# Patient Record
Sex: Male | Born: 1985 | Race: Black or African American | Hispanic: No | Marital: Single | Smoking: Current some day smoker
Health system: Southern US, Community
[De-identification: ages and names within clinical notes are randomized; demographics above are authoritative.]

## PROBLEM LIST (undated history)

## (undated) ENCOUNTER — Ambulatory Visit (HOSPITAL_COMMUNITY): Admission: EM | Payer: Self-pay

## (undated) DIAGNOSIS — F17200 Nicotine dependence, unspecified, uncomplicated: Secondary | ICD-10-CM

## (undated) DIAGNOSIS — F1910 Other psychoactive substance abuse, uncomplicated: Secondary | ICD-10-CM

## (undated) DIAGNOSIS — J45909 Unspecified asthma, uncomplicated: Secondary | ICD-10-CM

## (undated) DIAGNOSIS — F1290 Cannabis use, unspecified, uncomplicated: Secondary | ICD-10-CM

---
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
## 2007-08-05 ENCOUNTER — Emergency Department (HOSPITAL_COMMUNITY): Admission: EM | Admit: 2007-08-05 | Discharge: 2007-08-05 | Payer: Self-pay | Admitting: Emergency Medicine

## 2008-03-10 ENCOUNTER — Emergency Department (HOSPITAL_COMMUNITY): Admission: EM | Admit: 2008-03-10 | Discharge: 2008-03-10 | Payer: Self-pay | Admitting: Emergency Medicine

## 2010-09-10 ENCOUNTER — Emergency Department (HOSPITAL_COMMUNITY)
Admission: EM | Admit: 2010-09-10 | Discharge: 2010-09-10 | Disposition: A | Discharge status: Home / Self Care | Payer: Self-pay | Attending: Emergency Medicine | Admitting: Emergency Medicine

## 2010-09-10 DIAGNOSIS — R21 Rash and other nonspecific skin eruption: Secondary | ICD-10-CM | POA: Insufficient documentation

## 2010-09-10 DIAGNOSIS — B86 Scabies: Secondary | ICD-10-CM | POA: Insufficient documentation

## 2010-10-19 NOTE — Consult Note (Signed)
NAME:  Mario Jordan, Mario Jordan                     ACCOUNT NO.:  [id number]   MEDICAL RECORD NO.:  [id number]                   PATIENT TYPE:  EMS   LOCATION:  [facility]                                 FACILITY:  [facility]   PHYSICIAN:  Karol T. Lazarus Salines, M.D.              DATE OF BIRTH:  12/18/1985   DATE OF CONSULTATION:  03/26/2002  DATE OF DISCHARGE:  03/26/2002                                   CONSULTATION   CHIEF COMPLAINT:  Facial laceration.   HISTORY OF PRESENT ILLNESS:  The patient is a 25 year old black male who  stumbled and fell striking his face allegedly against a broken off metal  broomstick handle sustaining a laceration.  No apparent injury to the eye,  nose, or mouth.  No hearing problems, vision problems.  Occlusion is good.  No facial numbness.  No neck pain.  No radiating neurologic symptoms to  arms, legs, bowel, bladder.  Tetanus status is up-to-date.  He is otherwise  a healthy young man.   ALLERGIES:  No known drug allergies.   MEDICATIONS:  None.   SOCIAL HISTORY:  He is in the eighth grade at [employer].   REVIEW OF SYMPTOMS:  No known keloid formation or bleeding tendencies.   PHYSICAL EXAMINATION:  GENERAL:  This is an adolescent black male with a  large absorbant pad over his left face.  On removing this, he has an oblique  laceration beginning just lateral to the superior aspect of the nasal ala  going 1 cm lateral to the lateral canthus and then more deeply across the  superficial temporal region.  He has decent function of the upper and lower  eyelids and orbicularis oculi and also decent function of the left forehead.  Lower facial nerve is completely intact.  NEUROLOGIC:  Mental status is appropriate.  He hears well in conversational  speech.  Voice is clear, and respirations are unlabored.  HEENT:  Ears are clear with aerated drums of normal configuration.  Anterior  nose is clear internally.  Oral cavity reveals teeth in good repair with  moist membranes.  Oropharynx clear.  NECK:  Unremarkable.   On more careful inspection of the wound, the lower portion below the eye  next to the nose is superficial, although there is some elastic gapping of  the edges.  The upper portion goes through the subcutaneous fat and in some  areas down through the orbicularis oculi muscle.   IMPRESSION:  A 10 cm left mid sectional complex laceration with intact  facial nerve function.   PLAN:  With informed consent from his mother, anesthetized the mid face  beginning with Hurricane spray in the upper gingival buccal sulcus and an  infraorbital nerve block on that side.  A total of 20 cc of 2% Xylocaine  with 1:100,000 epinephrine used beginning with infraorbital nerve block and  then working along the wound on both sides.  Adequate time was  allowed for  hemostasis and anesthesia to take effect.  Upon ascertaining anesthesia, a  thorough scrubbing of the wound was performed with a 50/50 mixture of  Betadine and saline.  The deeper portions of the upper wound were probed  with the __________ forceps and no foreign material or foreign bodies were  identified.  The deeper aspects of the upper wound were closed with  interrupted 4-0 chromic suture, including the orbicularis oculi muscle and  the subcutaneous fat.  Finally, the remainder of the wound was closed taking  great pains to identify matching wound edges using interrupted 6-0 Ethilon  sutures.  Approximately 50 sutures total were placed.  The patient tolerated  the procedure nicely.  Hemostasis was observed.   We will have the patient back to his routine hygiene measures, including  wound cleaning with a Q-Tip and water and some antibiotic ointment  placement.  I have supplied him with some Tylenol No. 3 for pain relief.  He  will be using an ice pack for the first 24 hours and elevation for several  days.  We will take the sutures out in seven days.  They know to contact me  for any  signs of infection.  He is okay to go back to school on Monday,  three days from now.                                                Gloris Manchester. Lazarus Salines, M.D.    KTW/MEDQ  D:  03/26/2002  T:  03/28/2002  Job:  [id number]   cc:   Carleene Cooper III, M.D.  [address] [facility]  [place]  [place] [address]  Fax: [phone]

## 2011-06-26 ENCOUNTER — Emergency Department (HOSPITAL_COMMUNITY)
Admission: EM | Admit: 2011-06-26 | Discharge: 2011-06-26 | Disposition: A | Discharge status: Home / Self Care | Payer: Self-pay | Attending: Emergency Medicine | Admitting: Emergency Medicine

## 2011-06-26 ENCOUNTER — Encounter (HOSPITAL_COMMUNITY): Payer: Self-pay | Admitting: *Deleted

## 2011-06-26 DIAGNOSIS — J45909 Unspecified asthma, uncomplicated: Secondary | ICD-10-CM | POA: Insufficient documentation

## 2011-06-26 DIAGNOSIS — J029 Acute pharyngitis, unspecified: Secondary | ICD-10-CM | POA: Insufficient documentation

## 2011-06-26 DIAGNOSIS — R110 Nausea: Secondary | ICD-10-CM | POA: Insufficient documentation

## 2011-06-26 DIAGNOSIS — R109 Unspecified abdominal pain: Secondary | ICD-10-CM | POA: Insufficient documentation

## 2011-06-26 MED ORDER — DEXAMETHASONE 6 MG PO TABS
6.0000 mg | ORAL_TABLET | Freq: Once | ORAL | Status: AC
  Administered 2011-06-26: 6 mg via ORAL
  Filled 2011-06-26: qty 1

## 2011-06-26 MED ORDER — IBUPROFEN 800 MG PO TABS
800.0000 mg | ORAL_TABLET | Freq: Once | ORAL | Status: AC
  Administered 2011-06-26: 800 mg via ORAL
  Filled 2011-06-26: qty 1

## 2011-06-26 MED ORDER — IBUPROFEN 800 MG PO TABS: 800.0000 mg | ORAL_TABLET | Freq: Once | ORAL | Status: AC

## 2011-06-26 NOTE — ED Provider Notes (Signed)
History     CSN: [id number]  Arrival date & time 06/26/11  1927   First MD Initiated Contact with Patient 06/26/11 2116      Chief Complaint  Patient presents with  . Abdominal Pain    (Consider location/radiation/quality/duration/timing/severity/associated sxs/prior treatment) Patient is a 26 y.o. male presenting with abdominal pain. The history is provided by the patient.  Abdominal Pain The primary symptoms of the illness include abdominal pain and nausea. The primary symptoms of the illness do not include fever or shortness of breath. The onset of the illness was sudden.  The patient states that she believes she is currently not pregnant. Symptoms associated with the illness do not include chills.    Past Medical History  Diagnosis Date  . Asthma     History reviewed. No pertinent past surgical history.  No family history on file.  History  Substance Use Topics  . Smoking status: Not on file  . Smokeless tobacco: Not on file  . Alcohol Use: No      Review of Systems  Constitutional: Negative for fever and chills.  HENT: Positive for sore throat and trouble swallowing. Negative for drooling and voice change.   Respiratory: Negative for shortness of breath.   Gastrointestinal: Positive for nausea and abdominal pain.  Neurological: Negative for dizziness and weakness.    Allergies  Review of patient's allergies indicates no known allergies.  Home Medications   Current Outpatient Rx  Name Route Sig Dispense Refill  . IBUPROFEN 800 MG PO TABS Oral Take 1 tablet (800 mg total) by mouth once. 30 tablet 0    BP 133/89  Pulse 71  Temp(Src) 100.9 F (38.3 C) (Oral)  Resp 14  SpO2 100%  Physical Exam  Constitutional: He is oriented to person, place, and time. He appears well-developed and well-nourished.  HENT:  Head: Normocephalic.  Neck: Normal range of motion.  Cardiovascular: Normal rate.   Pulmonary/Chest: Effort normal.  Abdominal: Soft. Bowel  sounds are normal. He exhibits no distension. There is no tenderness.  Musculoskeletal: Normal range of motion.  Neurological: He is alert and oriented to person, place, and time.  Skin: Skin is warm and dry.    ED Course  Procedures (including critical care time)   Labs Reviewed  RAPID STREP SCREEN   No results found.   1. Pharyngitis     Strep throat will obtain rapid test   MDM  pharyngitis   Medical screening examination/treatment/procedure(s) were performed by non-physician practitioner and as supervising physician I was immediately available for consultation/collaboration. Osvaldo Human, M.D.      Arman Filter, NP 06/26/11 2157  Arman Filter, NP 06/26/11 2315  Arman Filter, NP 06/26/11 2315  Carleene Cooper III, MD 06/28/11 [phone]

## 2011-06-26 NOTE — ED Notes (Signed)
Pt took his sisters antibiotic and has had abdominal pain which began a hour ago.  Pt took the antibiotics due to sore throat and swollen tonsills.  He was given abt for this in the past which is why he decided to take a antibiotic pill (unknown name) from his sister.   No fever or sob with this. No hives or rash

## 2011-09-04 ENCOUNTER — Encounter (HOSPITAL_COMMUNITY): Payer: Self-pay | Admitting: Emergency Medicine

## 2011-09-04 ENCOUNTER — Emergency Department (HOSPITAL_COMMUNITY)
Admission: EM | Admit: 2011-09-04 | Discharge: 2011-09-04 | Disposition: A | Discharge status: Home / Self Care | Payer: Self-pay | Attending: Emergency Medicine | Admitting: Emergency Medicine

## 2011-09-04 ENCOUNTER — Emergency Department (HOSPITAL_COMMUNITY): Payer: Self-pay

## 2011-09-04 DIAGNOSIS — S93401A Sprain of unspecified ligament of right ankle, initial encounter: Secondary | ICD-10-CM

## 2011-09-04 DIAGNOSIS — Y998 Other external cause status: Secondary | ICD-10-CM | POA: Insufficient documentation

## 2011-09-04 DIAGNOSIS — S93409A Sprain of unspecified ligament of unspecified ankle, initial encounter: Secondary | ICD-10-CM | POA: Insufficient documentation

## 2011-09-04 DIAGNOSIS — X500XXA Overexertion from strenuous movement or load, initial encounter: Secondary | ICD-10-CM | POA: Insufficient documentation

## 2011-09-04 DIAGNOSIS — J45909 Unspecified asthma, uncomplicated: Secondary | ICD-10-CM | POA: Insufficient documentation

## 2011-09-04 DIAGNOSIS — F17200 Nicotine dependence, unspecified, uncomplicated: Secondary | ICD-10-CM | POA: Insufficient documentation

## 2011-09-04 DIAGNOSIS — Y9367 Activity, basketball: Secondary | ICD-10-CM | POA: Insufficient documentation

## 2011-09-04 NOTE — Discharge Instructions (Signed)
Ankle Sprain An ankle sprain is an injury to the strong, fibrous tissues (ligaments) that hold the bones of your ankle joint together.  CAUSES Ankle sprain usually is caused by a fall or by twisting your ankle. People who participate in sports are more prone to these types of injuries.  SYMPTOMS  Symptoms of ankle sprain include:  Pain in your ankle. The pain may be present at rest or only when you are trying to stand or walk.   Swelling.   Bruising. Bruising may develop immediately or within 1 to 2 days after your injury.   Difficulty standing or walking.  DIAGNOSIS  Your caregiver will ask you details about your injury and perform a physical exam of your ankle to determine if you have an ankle sprain. During the physical exam, your caregiver will press and squeeze specific areas of your foot and ankle. Your caregiver will try to move your ankle in certain ways. An X-ray exam may be done to be sure a bone was not broken or a ligament did not separate from one of the bones in your ankle (avulsion).  TREATMENT  Certain types of braces can help stabilize your ankle. Your caregiver can make a recommendation for this. Your caregiver may recommend the use of medication for pain. If your sprain is severe, your caregiver may refer you to a surgeon who helps to restore function to parts of your skeletal system (orthopedist) or a physical therapist. HOME CARE INSTRUCTIONS  Apply ice to your injury for 1 to 2 days or as directed by your caregiver. Applying ice helps to reduce inflammation and pain.  Put ice in a plastic bag.   Place a towel between your skin and the bag.   Leave the ice on for 15 to 20 minutes at a time, every 2 hours while you are awake.   Take over-the-counter or prescription medicines for pain, discomfort, or fever only as directed by your caregiver.   Keep your injured leg elevated, when possible, to lessen swelling.   If your caregiver recommends crutches, use them as  instructed. Gradually, put weight on the affected ankle. Continue to use crutches or a cane until you can walk without feeling pain in your ankle.   If you have a plaster splint, wear the splint as directed by your caregiver. Do not rest it on anything harder than a pillow the first 24 hours. Do not put weight on it. Do not get it wet. You may take it off to take a shower or bath.   You may have been given an elastic bandage to wear around your ankle to provide support. If the elastic bandage is too tight (you have numbness or tingling in your foot or your foot becomes cold and blue), adjust the bandage to make it comfortable.   If you have an air splint, you may blow more air into it or let air out to make it more comfortable. You may take your splint off at night and before taking a shower or bath.   Wiggle your toes in the splint several times per day if you are able.  SEEK MEDICAL CARE IF:   You have an increase in bruising, swelling, or pain.   Your toes feel cold.   Pain relief is not achieved with medication.  SEEK IMMEDIATE MEDICAL CARE IF: Your toes are numb or blue or you have severe pain. MAKE SURE YOU:   Understand these instructions.   Will watch your condition.     Will get help right away if you are not doing well or get worse.  Document Released: 05/20/2005 Document Revised: 05/09/2011 Document Reviewed: 12/23/2007 [facility] Patient Information 2012 [place], [place].  X-ray was negative. Ice, elevate, ankle brace, Tylenol or Motrin for pain.

## 2011-09-04 NOTE — ED Notes (Signed)
Patient is AOx4 and comfortable with his discharge instructions. 

## 2011-09-04 NOTE — ED Notes (Signed)
PT. REPORTS INJURY TO RIGHT ANKLE WITH PAIN WHILE PLAYING BASKETBALL YESTERDAY , AMBULATORY, SLIGHT SWELLING .

## 2011-09-04 NOTE — ED Provider Notes (Signed)
History     CSN: [id number]  Arrival date & time 09/04/11  0158   First MD Initiated Contact with Patient 09/04/11 0245      Chief Complaint  Patient presents with  . Ankle Pain    (Consider location/radiation/quality/duration/timing/severity/associated sxs/prior treatment) HPI... inversion injury to right ankle in basketball game yesterday. It is moderate. Palpation makes it worse. Described as sharp. No other injuries  Past Medical History  Diagnosis Date  . Asthma     History reviewed. No pertinent past surgical history.  No family history on file.  History  Substance Use Topics  . Smoking status: Current Everyday Smoker  . Smokeless tobacco: Not on file  . Alcohol Use: Yes      Review of Systems  All other systems reviewed and are negative.    Allergies  Review of patient's allergies indicates no known allergies.  Home Medications  No current outpatient prescriptions on file.  BP 105/88  Pulse 85  Temp(Src) 97.9 F (36.6 C) (Oral)  Resp 19  SpO2 98%  Physical Exam  Nursing note and vitals reviewed. Constitutional: He is oriented to person, place, and time. He appears well-developed and well-nourished.  HENT:  Head: Normocephalic and atraumatic.  Eyes: Conjunctivae and EOM are normal. Pupils are equal, round, and reactive to light.  Neck: Normal range of motion. Neck supple.  Cardiovascular: Normal rate and regular rhythm.   Pulmonary/Chest: Effort normal and breath sounds normal.  Abdominal: Soft. Bowel sounds are normal.  Musculoskeletal: Normal range of motion.       Pain in medial and lateral aspect of right ankle;  minimal pain with range of motion. Neurovascular intact the  Neurological: He is alert and oriented to person, place, and time.  Skin: Skin is warm and dry.  Psychiatric: He has a normal mood and affect.    ED Course  Procedures (including critical care time)  Labs Reviewed - No data to display Dg Ankle Complete  Right  09/04/2011  *RADIOLOGY REPORT*  Clinical Data: Healed pain after basketball injury.  RIGHT ANKLE - COMPLETE 3+ VIEW  Comparison: None.  Findings: Large amount of soft tissue over the lateral aspect of the midfoot.  No evidence of acute fracture or subluxation of the ankle.  Ankle mortis and talar dome appear intact.  No focal bone lesion or bone destruction.  Accessory ossicle adjacent to the cuboidal.  No radiopaque foreign bodies in the soft tissues.  IMPRESSION: Soft tissue swelling.  No acute bony abnormalities identified.  Original Report Authenticated By: Marlon Pel, M.D.     1. Right ankle sprain       MDM  History and physical consistent with mild right ankle sprain. No fracture. Ankle brace, elevate, ice, nonsteroidals        Donnetta Hutching, MD 09/04/11 [phone]

## 2012-03-04 ENCOUNTER — Emergency Department (HOSPITAL_COMMUNITY)
Admission: EM | Admit: 2012-03-04 | Discharge: 2012-03-04 | Disposition: A | Discharge status: Home / Self Care | Payer: Medicaid Other | Attending: Emergency Medicine | Admitting: Emergency Medicine

## 2012-03-04 ENCOUNTER — Emergency Department (HOSPITAL_COMMUNITY): Payer: Medicaid Other

## 2012-03-04 ENCOUNTER — Encounter (HOSPITAL_COMMUNITY): Payer: Self-pay | Admitting: *Deleted

## 2012-03-04 DIAGNOSIS — J45909 Unspecified asthma, uncomplicated: Secondary | ICD-10-CM | POA: Insufficient documentation

## 2012-03-04 DIAGNOSIS — F17200 Nicotine dependence, unspecified, uncomplicated: Secondary | ICD-10-CM | POA: Insufficient documentation

## 2012-03-04 LAB — CBC WITH DIFFERENTIAL/PLATELET
Eosinophils Relative: 0 % (ref 0–5)
Lymphocytes Relative: 12 % (ref 12–46)
Lymphs Abs: 0.8 10*3/uL (ref 0.7–4.0)
MCV: 96 fL (ref 78.0–100.0)
Platelets: 194 10*3/uL (ref 150–400)
RBC: 4.04 MIL/uL — ABNORMAL LOW (ref 4.22–5.81)
WBC: 6.4 10*3/uL (ref 4.0–10.5)

## 2012-03-04 LAB — BASIC METABOLIC PANEL
CO2: 29 mEq/L (ref 19–32)
Calcium: 9.6 mg/dL (ref 8.4–10.5)
Chloride: 99 mEq/L (ref 96–112)
Glucose, Bld: 84 mg/dL (ref 70–99)
Potassium: 3.9 mEq/L (ref 3.5–5.1)
Sodium: 137 mEq/L (ref 135–145)

## 2012-03-04 LAB — URINE MICROSCOPIC-ADD ON

## 2012-03-04 LAB — URINALYSIS, ROUTINE W REFLEX MICROSCOPIC
Glucose, UA: NEGATIVE mg/dL
Specific Gravity, Urine: 1.029 (ref 1.005–1.030)
pH: 7 (ref 5.0–8.0)

## 2012-03-04 MED ORDER — PREDNISONE 20 MG PO TABS: 60.0000 mg | ORAL_TABLET | Freq: Every day | ORAL | Status: DC

## 2012-03-04 MED ORDER — AZITHROMYCIN 250 MG PO TABS: 250.0000 mg | ORAL_TABLET | Freq: Every day | ORAL | Status: DC

## 2012-03-04 MED ORDER — BENZONATATE 100 MG PO CAPS: 100.0000 mg | ORAL_CAPSULE | Freq: Three times a day (TID) | ORAL | Status: DC | PRN

## 2012-03-04 MED ORDER — ALBUTEROL SULFATE HFA 108 (90 BASE) MCG/ACT IN AERS
2.0000 | INHALATION_SPRAY | RESPIRATORY_TRACT | Status: DC | PRN
  Administered 2012-03-04: 2 via RESPIRATORY_TRACT
  Filled 2012-03-04: qty 6.7

## 2012-03-04 MED ORDER — ALBUTEROL SULFATE (5 MG/ML) 0.5% IN NEBU
5.0000 mg | INHALATION_SOLUTION | Freq: Once | RESPIRATORY_TRACT | Status: AC
  Administered 2012-03-04: 5 mg via RESPIRATORY_TRACT
  Filled 2012-03-04: qty 1

## 2012-03-04 MED ORDER — IPRATROPIUM BROMIDE 0.02 % IN SOLN
0.5000 mg | Freq: Once | RESPIRATORY_TRACT | Status: AC
  Administered 2012-03-04: 0.5 mg via RESPIRATORY_TRACT
  Filled 2012-03-04: qty 2.5

## 2012-03-04 MED ORDER — PREDNISONE 20 MG PO TABS
60.0000 mg | ORAL_TABLET | Freq: Once | ORAL | Status: AC
  Administered 2012-03-04: 60 mg via ORAL
  Filled 2012-03-04: qty 3

## 2012-03-04 NOTE — ED Provider Notes (Signed)
History     CSN: [id number]  Arrival date & time 03/04/12  1805   First MD Initiated Contact with Patient 03/04/12 2031      Chief Complaint  Patient presents with  . asthma   . chest congestion   . Emesis    (Consider location/radiation/quality/duration/timing/severity/associated sxs/prior treatment) HPI Comments: STATON MARKEY presents with a 3 day history of increased cough which has been productive of yellow sputum,  Wheezing along with shortness of breath and low grade fever and chills.  He has a history of asthma,  His son lost his inhaler so has been out of his albuterol.  He denies chest pain,  But has had 1 episode of post tussive vomiting earlier today.    The history is provided by the patient.    Past Medical History  Diagnosis Date  . Asthma     History reviewed. No pertinent past surgical history.  No family history on file.  History  Substance Use Topics  . Smoking status: Current Every Day Smoker  . Smokeless tobacco: Not on file  . Alcohol Use: Yes      Review of Systems  Constitutional: Positive for fever.  HENT: Negative for congestion, sore throat and neck pain.   Eyes: Negative.   Respiratory: Positive for cough, shortness of breath and wheezing. Negative for chest tightness.   Cardiovascular: Negative for chest pain.  Gastrointestinal: Negative for nausea and abdominal pain.  Genitourinary: Negative.   Musculoskeletal: Negative for joint swelling and arthralgias.  Skin: Negative.  Negative for rash and wound.  Neurological: Negative for dizziness, weakness, light-headedness, numbness and headaches.  Hematological: Negative.   Psychiatric/Behavioral: Negative.     Allergies  Review of patient's allergies indicates no known allergies.  Home Medications   Current Outpatient Rx  Name Route Sig Dispense Refill  . PREDNISONE 20 MG PO TABS Oral Take 3 tablets (60 mg total) by mouth daily. 12 tablet 0    BP 130/70  Pulse 82  Temp  101.4 F (38.6 C) (Oral)  Resp 18  SpO2 100%  Physical Exam  Nursing note and vitals reviewed. Constitutional: He appears well-developed and well-nourished.  HENT:  Head: Normocephalic and atraumatic.  Eyes: Conjunctivae normal are normal.  Neck: Normal range of motion.  Cardiovascular: Normal rate, regular rhythm, normal heart sounds and intact distal pulses.   Pulmonary/Chest: Effort normal. No respiratory distress. He has wheezes. He has no rales.       Sparse expiratory wheeze.  Abdominal: Soft. Bowel sounds are normal. There is no tenderness.  Musculoskeletal: Normal range of motion.  Neurological: He is alert.  Skin: Skin is warm and dry.  Psychiatric: He has a normal mood and affect.    ED Course  Procedures (including critical care time)  Labs Reviewed  CBC WITH DIFFERENTIAL - Abnormal; Notable for the following:    RBC 4.04 (*)     HCT 38.8 (*)     Monocytes Relative 18 (*)     Monocytes Absolute 1.1 (*)     All other components within normal limits  URINALYSIS, ROUTINE W REFLEX MICROSCOPIC - Abnormal; Notable for the following:    Hgb urine dipstick TRACE (*)     Bilirubin Urine SMALL (*)     Urobilinogen, UA 2.0 (*)     All other components within normal limits  BASIC METABOLIC PANEL  URINE MICROSCOPIC-ADD ON   Dg Chest 2 View  03/04/2012  *RADIOLOGY REPORT*  Clinical Data: Cough, congestion  CHEST -  2 VIEW  Comparison: 08/05/2007  Findings: Cardiomediastinal silhouette is stable.  No acute infiltrate or pleural effusion.  No pulmonary edema.  Bony thorax is unremarkable.  IMPRESSION: No active disease.   Original Report Authenticated By: Natasha Mead, M.D.      1. Asthma    Pt given albuterol 5/atrovent 0.5 mg Neb tx,  Prednisone 60 mg po with improved sx at time of dc.   MDM  Pt with stable asthma,  Xray reviewed prior to dc home.  With h/o copd, fever will cover for possible early pneumonia with course of zithromax.  Pt was also given a mdi for home use.   Pulse dose prednisone x 4 additional days.  Tessalon for cough relief. Advised to return prn for worsened sx.        Burgess Amor, PA 03/06/12 1250

## 2012-03-04 NOTE — ED Notes (Signed)
PT is here with asthma acting up and coughing up yellow sputum.  Pt reports vomiting today.  No abdominal pain.  Temp at triage

## 2012-03-04 NOTE — ED Notes (Signed)
Pt c/o cough and cold. Pt reports chest pain when he coughs, 8/10. Pt c/o vomiting this morning and nasal drainage in back of throat. Pt has asthma and lost his inhaler, so he hasn't been able to use it. Pt has been able to keep fluids down but unable to keep food down.

## 2012-03-07 NOTE — ED Provider Notes (Signed)
Medical screening examination/treatment/procedure(s) were performed by non-physician practitioner and as supervising physician I was immediately available for consultation/collaboration.   Carleene Cooper III, MD 03/07/12 [phone]

## 2012-03-29 ENCOUNTER — Encounter (HOSPITAL_COMMUNITY): Payer: Self-pay | Admitting: Emergency Medicine

## 2012-03-29 DIAGNOSIS — F17200 Nicotine dependence, unspecified, uncomplicated: Secondary | ICD-10-CM | POA: Insufficient documentation

## 2012-03-29 DIAGNOSIS — K089 Disorder of teeth and supporting structures, unspecified: Secondary | ICD-10-CM | POA: Insufficient documentation

## 2012-03-29 DIAGNOSIS — J45909 Unspecified asthma, uncomplicated: Secondary | ICD-10-CM | POA: Insufficient documentation

## 2012-03-29 NOTE — ED Notes (Signed)
Reports has hole in tooth, says on and off pain, having pain to L lower tooth

## 2012-03-30 ENCOUNTER — Emergency Department (HOSPITAL_COMMUNITY)
Admission: EM | Admit: 2012-03-30 | Discharge: 2012-03-30 | Disposition: A | Discharge status: Home / Self Care | Payer: Medicaid Other | Attending: Emergency Medicine | Admitting: Emergency Medicine

## 2012-03-30 DIAGNOSIS — K0889 Other specified disorders of teeth and supporting structures: Secondary | ICD-10-CM

## 2012-03-30 MED ORDER — TRAMADOL HCL 50 MG PO TABS: 50.0000 mg | ORAL_TABLET | Freq: Four times a day (QID) | ORAL | Status: DC | PRN

## 2012-03-30 MED ORDER — TRAMADOL HCL 50 MG PO TABS
50.0000 mg | ORAL_TABLET | Freq: Once | ORAL | Status: AC
  Administered 2012-03-30: 50 mg via ORAL
  Filled 2012-03-30: qty 1

## 2012-03-30 NOTE — ED Provider Notes (Signed)
History     CSN: [id number]  Arrival date & time 03/29/12  2222   First MD Initiated Contact with Patient 03/30/12 0051      Chief Complaint  Patient presents with  . Dental Pain    (Consider location/radiation/quality/duration/timing/severity/associated sxs/prior treatment) HPI Comments: Patient with large cavity in L lower molar Taking OTC Advil without relief States temperature effects pain   Patient is a 26 y.o. male presenting with tooth pain. The history is provided by the patient.  Dental PainThe primary symptoms include mouth pain. Primary symptoms do not include dental injury, headaches or fever. The symptoms began more than 1 month ago.    Past Medical History  Diagnosis Date  . Asthma     History reviewed. No pertinent past surgical history.  History reviewed. No pertinent family history.  History  Substance Use Topics  . Smoking status: Current Some Day Smoker    Types: Cigarettes  . Smokeless tobacco: Not on file  . Alcohol Use: Yes     occasion      Review of Systems  Constitutional: Negative for fever and chills.  HENT: Positive for dental problem.   Neurological: Negative for dizziness and headaches.    Allergies  Review of patient's allergies indicates no known allergies.  Home Medications   Current Outpatient Rx  Name Route Sig Dispense Refill  . NAPROXEN SODIUM 220 MG PO TABS Oral Take 220 mg by mouth 2 (two) times daily as needed. For tooth pain    . TRAMADOL HCL 50 MG PO TABS Oral Take 1 tablet (50 mg total) by mouth every 6 (six) hours as needed for pain. 12 tablet 0    BP 128/87  Pulse 89  Temp 98.3 F (36.8 C) (Oral)  Resp 18  SpO2 100%  Physical Exam  Constitutional: He appears well-developed.  HENT:  Head: Normocephalic.  Mouth/Throat:    Eyes: Pupils are equal, round, and reactive to light.  Neck: Normal range of motion.    ED Course  Procedures (including critical care time)  Labs Reviewed - No data to  display No results found.   1. Toothache       MDM  Dental cavity        Arman Filter, NP 03/30/12 0115  Arman Filter, NP 03/30/12 5621

## 2012-03-30 NOTE — ED Provider Notes (Signed)
Medical screening examination/treatment/procedure(s) were performed by non-physician practitioner and as supervising physician I was immediately available for consultation/collaboration.  Cloyd Ragas K Iyannah Blake-Rasch, MD 03/30/12 0351 

## 2012-08-08 ENCOUNTER — Emergency Department (HOSPITAL_COMMUNITY)
Admission: EM | Admit: 2012-08-08 | Discharge: 2012-08-08 | Disposition: A | Discharge status: Home / Self Care | Payer: Medicaid Other | Attending: Emergency Medicine | Admitting: Emergency Medicine

## 2012-08-08 ENCOUNTER — Encounter (HOSPITAL_COMMUNITY): Payer: Self-pay | Admitting: *Deleted

## 2012-08-08 ENCOUNTER — Emergency Department (HOSPITAL_COMMUNITY): Payer: Medicaid Other

## 2012-08-08 DIAGNOSIS — S61209A Unspecified open wound of unspecified finger without damage to nail, initial encounter: Secondary | ICD-10-CM | POA: Insufficient documentation

## 2012-08-08 DIAGNOSIS — J45909 Unspecified asthma, uncomplicated: Secondary | ICD-10-CM | POA: Insufficient documentation

## 2012-08-08 DIAGNOSIS — S6990XA Unspecified injury of unspecified wrist, hand and finger(s), initial encounter: Secondary | ICD-10-CM | POA: Insufficient documentation

## 2012-08-08 DIAGNOSIS — S6980XA Other specified injuries of unspecified wrist, hand and finger(s), initial encounter: Secondary | ICD-10-CM | POA: Insufficient documentation

## 2012-08-08 DIAGNOSIS — Y9389 Activity, other specified: Secondary | ICD-10-CM | POA: Insufficient documentation

## 2012-08-08 DIAGNOSIS — S60132A Contusion of left middle finger with damage to nail, initial encounter: Secondary | ICD-10-CM

## 2012-08-08 DIAGNOSIS — Y9289 Other specified places as the place of occurrence of the external cause: Secondary | ICD-10-CM | POA: Insufficient documentation

## 2012-08-08 DIAGNOSIS — X58XXXA Exposure to other specified factors, initial encounter: Secondary | ICD-10-CM | POA: Insufficient documentation

## 2012-08-08 DIAGNOSIS — F17200 Nicotine dependence, unspecified, uncomplicated: Secondary | ICD-10-CM | POA: Insufficient documentation

## 2012-08-08 DIAGNOSIS — IMO0001 Reserved for inherently not codable concepts without codable children: Secondary | ICD-10-CM | POA: Insufficient documentation

## 2012-08-08 DIAGNOSIS — S6991XA Unspecified injury of right wrist, hand and finger(s), initial encounter: Secondary | ICD-10-CM

## 2012-08-08 MED ORDER — HYDROCODONE-ACETAMINOPHEN 5-325 MG PO TABS
1.0000 | ORAL_TABLET | Freq: Once | ORAL | Status: AC
  Administered 2012-08-08: 1 via ORAL
  Filled 2012-08-08: qty 1

## 2012-08-08 MED ORDER — CEPHALEXIN 500 MG PO CAPS: 500.0000 mg | ORAL_CAPSULE | Freq: Four times a day (QID) | ORAL | Status: DC

## 2012-08-08 MED ORDER — HYDROCODONE-ACETAMINOPHEN 5-325 MG PO TABS: 1.0000 | ORAL_TABLET | ORAL | Status: DC | PRN

## 2012-08-08 NOTE — ED Notes (Signed)
Pt reports around 7am today he slammed his finger into a car door. Pt c/o pain to 3rd digit on right hand. Swelling and bruising seen finger.

## 2012-08-08 NOTE — ED Provider Notes (Signed)
Medical screening examination/treatment/procedure(s) were performed by non-physician practitioner and as supervising physician I was immediately available for consultation/collaboration.  Doug Sou, MD 08/08/12 [phone]

## 2012-08-08 NOTE — ED Notes (Signed)
PT slammed right middle finger in car and now with severe pain.  Skin intact

## 2012-08-08 NOTE — ED Notes (Signed)
Pt back from radiology 

## 2012-08-08 NOTE — ED Notes (Signed)
Patient transported to X-ray 

## 2012-08-08 NOTE — ED Provider Notes (Signed)
History     CSN: [id number]  Arrival date & time 08/08/12  1030   First MD Initiated Contact with Patient 08/08/12 1329      Chief Complaint  Patient presents with  . Finger Injury    (Consider location/radiation/quality/duration/timing/severity/associated sxs/prior treatment) HPI  27 year old male presents for evaluations of finger injury. Patient states he accidentally slammed the car door on his right middle finger this morning. Describe pain as a sharp throbbing sensation, severe, nonradiating, to the tip of his finger.  Pt notice increase pressure and pain with his nail turning black. Denies palm of hand, or wrist pain.  Denies numbness.  Tetanus UTD.    Past Medical History  Diagnosis Date  . Asthma     History reviewed. No pertinent past surgical history.  No family history on file.  History  Substance Use Topics  . Smoking status: Current Some Day Smoker    Types: Cigarettes  . Smokeless tobacco: Not on file  . Alcohol Use: Yes     Comment: occasion      Review of Systems  Constitutional: Negative for fever.  Musculoskeletal: Positive for myalgias. Negative for joint swelling.  Skin: Positive for wound.  Neurological: Negative for numbness.    Allergies  Review of patient's allergies indicates no known allergies.  Home Medications   Current Outpatient Rx  Name  Route  Sig  Dispense  Refill  . Aspirin-Acetaminophen-Caffeine (EXCEDRIN EXTRA STRENGTH PO)   Oral   Take 4 tablets by mouth once as needed (pain).           BP 131/77  Pulse 62  Temp(Src) 98 F (36.7 C) (Oral)  Resp 18  SpO2 99%  Physical Exam  Nursing note and vitals reviewed. Constitutional: He appears well-developed and well-nourished. No distress.  HENT:  Head: Atraumatic.  Eyes: Conjunctivae are normal.  Neck: Neck supple.  Musculoskeletal: He exhibits tenderness (R middle finger: ttp overlying the nail with 90% subungual hematoma.  No pain overlying joint space at  DIP/PIP/MCP. brisk cap refill, sensation intact).  Neurological: He is alert.  Skin: Skin is warm.  Psychiatric: He has a normal mood and affect.    ED Course  NERVE BLOCK Date/Time: 08/08/2012 3:33 PM Performed by: Fayrene Helper Authorized by: Fayrene Helper Consent: Verbal consent obtained. Risks and benefits: risks, benefits and alternatives were discussed Consent given by: patient Patient understanding: patient states understanding of the procedure being performed Patient consent: the patient's understanding of the procedure matches consent given Patient identity confirmed: verbally with patient and arm band Indications: pain relief Nerve block body site: right middle finger. Preparation: Patient was prepped and draped in the usual sterile fashion. Needle gauge: 25 G Location technique: anatomical landmarks Local anesthetic: lidocaine 2% without epinephrine Anesthetic total: 5 ml Outcome: pain improved Patient tolerance: Patient tolerated the procedure well with no immediate complications. Comments: Procedure performed by Rob Bunting, PA-S under my direct supervision   (including critical care time)  3:24 PM Pt injured his R middle finger from slamming a  Car door and has a nail bed injury with a 90% subungual hematoma.  Xray shows no acute fx or dislocation.  He is NVI.  Nail trephination performed with digital block which provide significant relief.  Finger splint provided, care instruction given.  Pt to have close f/u with hand specialist if pain worsen as he may benefit from nail removal and washout.  Antibiotic provide as requested.      Labs Reviewed - No data to  display Dg Hand Complete Right  08/08/2012  *RADIOLOGY REPORT*  Clinical Data: Pain and bruising of distal middle finger.  RIGHT HAND - COMPLETE 3+ VIEW  Comparison: None.  Findings: No acute fracture or dislocation.  Mild soft tissue swelling about the distal third digit.  IMPRESSION: No acute osseous abnormality.    Original Report Authenticated By: Jeronimo Greaves, M.D.      1. Injury of middle finger, right, initial encounter   2. Subungual hematoma of third finger of left hand, initial encounter     BP 145/82  Pulse 59  Temp(Src) 98 F (36.7 C) (Oral)  Resp 18  SpO2 100%   MDM          Fayrene Helper, PA-C 08/08/12 1534

## 2012-08-08 NOTE — ED Notes (Signed)
PA at bedside.

## 2012-08-08 NOTE — ED Notes (Signed)
Ortho tech at bedside 

## 2012-08-08 NOTE — ED Notes (Signed)
Paged ortho 

## 2012-08-08 NOTE — ED Notes (Signed)
Pt given discharge paperwork; pt verbalized understanding of d/c and f/u; no additional questions by pt regarding d/c; VSS; e-signature obtained;

## 2016-07-21 ENCOUNTER — Encounter (HOSPITAL_COMMUNITY): Payer: Self-pay | Admitting: Emergency Medicine

## 2016-07-21 ENCOUNTER — Emergency Department (HOSPITAL_COMMUNITY): Payer: Self-pay

## 2016-07-21 ENCOUNTER — Emergency Department (HOSPITAL_COMMUNITY)
Admission: EM | Admit: 2016-07-21 | Discharge: 2016-07-21 | Disposition: A | Discharge status: Home / Self Care | Payer: Self-pay | Attending: Emergency Medicine | Admitting: Emergency Medicine

## 2016-07-21 DIAGNOSIS — J111 Influenza due to unidentified influenza virus with other respiratory manifestations: Secondary | ICD-10-CM

## 2016-07-21 DIAGNOSIS — F17210 Nicotine dependence, cigarettes, uncomplicated: Secondary | ICD-10-CM | POA: Insufficient documentation

## 2016-07-21 DIAGNOSIS — R69 Illness, unspecified: Secondary | ICD-10-CM

## 2016-07-21 DIAGNOSIS — J45909 Unspecified asthma, uncomplicated: Secondary | ICD-10-CM | POA: Insufficient documentation

## 2016-07-21 DIAGNOSIS — Z79899 Other long term (current) drug therapy: Secondary | ICD-10-CM | POA: Insufficient documentation

## 2016-07-21 DIAGNOSIS — Z7982 Long term (current) use of aspirin: Secondary | ICD-10-CM | POA: Insufficient documentation

## 2016-07-21 MED ORDER — ONDANSETRON 4 MG PO TBDP
4.0000 mg | ORAL_TABLET | Freq: Once | ORAL | Status: AC
  Administered 2016-07-21: 4 mg via ORAL
  Filled 2016-07-21: qty 1

## 2016-07-21 MED ORDER — HYDROCODONE-ACETAMINOPHEN 5-325 MG PO TABS: ORAL_TABLET | ORAL | 0 refills | Status: DC

## 2016-07-21 MED ORDER — HYDROCODONE-ACETAMINOPHEN 5-325 MG PO TABS
1.0000 | ORAL_TABLET | Freq: Once | ORAL | Status: AC
  Administered 2016-07-21: 1 via ORAL
  Filled 2016-07-21: qty 1

## 2016-07-21 MED ORDER — OSELTAMIVIR PHOSPHATE 75 MG PO CAPS: 75.0000 mg | ORAL_CAPSULE | Freq: Two times a day (BID) | ORAL | 0 refills | Status: DC

## 2016-07-21 MED ORDER — OSELTAMIVIR PHOSPHATE 75 MG PO CAPS
75.0000 mg | ORAL_CAPSULE | Freq: Once | ORAL | Status: AC
  Administered 2016-07-21: 75 mg via ORAL
  Filled 2016-07-21: qty 1

## 2016-07-21 NOTE — ED Notes (Signed)
Wrong documentation for triage PULSE 90 , NOT 9

## 2016-07-21 NOTE — ED Notes (Signed)
ED Provider at bedside. 

## 2016-07-21 NOTE — ED Triage Notes (Signed)
Pt. Stated I caught it from my son. I have a cough and congestion that  started 2 days ago.

## 2016-07-21 NOTE — ED Provider Notes (Signed)
[facility]-EMERGENCY DEPT Provider Note   CSN: [id number] Arrival date & time: 07/21/16  1036     History   Chief Complaint Chief Complaint  Patient presents with  . Cough  . Nasal Congestion    HPI   Blood pressure 115/69, pulse 83, temperature 99.2 F (37.3 C), temperature source Oral, resp. rate 16, height 5\' 6"  (1.676 m), weight 70.3 kg, SpO2 98 %.  Mario Jordan is a 31 y.o. male medical history significant for asthma (has never been hospitalized/intubated) complaining of rhinorrhea, cough, pleuritic chest pain, subjective fever, chills. He denies headache, cervicalgia, wheezing, abdominal pain, change in bowel or bladder habits. He has positive sick contacts in that his son and another family member also sick.  Past Medical History:  Diagnosis Date  . Asthma     There are no active problems to display for this patient.   History reviewed. No pertinent surgical history.     Home Medications    Prior to Admission medications   Medication Sig Start Date End Date Taking? Authorizing Provider  Aspirin-Acetaminophen-Caffeine (EXCEDRIN EXTRA STRENGTH PO) Take 4 tablets by mouth once as needed (pain).    Historical Provider, MD  cephALEXin (KEFLEX) 500 MG capsule Take 1 capsule (500 mg total) by mouth 4 (four) times daily. 08/08/12   Fayrene Helper, PA-C  HYDROcodone-acetaminophen (NORCO/VICODIN) 5-325 MG tablet Take 1-2 tablets by mouth every 6 hours as needed for pain and/or cough. 07/21/16   Oprah Camarena, PA-C  oseltamivir (TAMIFLU) 75 MG capsule Take 1 capsule (75 mg total) by mouth every 12 (twelve) hours. 07/21/16   Joni Reining Akio Hudnall, PA-C    Family History No family history on file.  Social History Social History  Substance Use Topics  . Smoking status: Current Some Day Smoker    Types: Cigarettes  . Smokeless tobacco: Current User  . Alcohol use Yes     Comment: occasion     Allergies   Patient has no known allergies.   Review of Systems Review of  Systems  10 systems reviewed and found to be negative, except as noted in the HPI.   Physical Exam Updated Vital Signs BP 115/69   Pulse 83   Temp 99.2 F (37.3 C) (Oral)   Resp 16   Ht 5\' 6"  (1.676 m)   Wt 70.3 kg   SpO2 98%   BMI 25.02 kg/m   Physical Exam  Constitutional: He is oriented to person, place, and time. He appears well-developed and well-nourished. No distress.  HENT:  Head: Normocephalic and atraumatic.  Right Ear: External ear normal.  Left Ear: External ear normal.  Mouth/Throat: Oropharynx is clear and moist. No oropharyngeal exudate.  No drooling or stridor. Posterior pharynx mildly erythematous no significant tonsillar hypertrophy. No exudate. Soft palate rises symmetrically. No TTP or induration under tongue.   No tenderness to palpation of frontal or bilateral maxillary sinuses.  Mild mucosal edema in the nares with scant rhinorrhea.  Bilateral tympanic membranes with normal architecture and good light reflex.    Eyes: Conjunctivae and EOM are normal. Pupils are equal, round, and reactive to light.  Neck: Normal range of motion. Neck supple.  Cardiovascular: Normal rate, regular rhythm and intact distal pulses.   Pulmonary/Chest: Effort normal and breath sounds normal. No stridor. No respiratory distress. He has no wheezes. He has no rales. He exhibits no tenderness.  Abdominal: Soft. There is no tenderness. There is no rebound and no guarding.  Musculoskeletal: Normal range of motion.  Neurological: He  is alert and oriented to person, place, and time.  Skin: He is not diaphoretic.  Psychiatric: He has a normal mood and affect.  Nursing note and vitals reviewed.    ED Treatments / Results  Labs (all labs ordered are listed, but only abnormal results are displayed) Labs Reviewed - No data to display  EKG  EKG Interpretation None       Radiology Dg Chest 2 View  Result Date: 07/21/2016 CLINICAL DATA:  Flu-like symptoms EXAM: CHEST  2  VIEW COMPARISON:  03/04/2012 FINDINGS: Normal heart size. Lungs clear. No pneumothorax. No pleural effusion. IMPRESSION: No active cardiopulmonary disease. Electronically Signed   By: Jolaine ClickArthur  Hoss M.D.   On: 07/21/2016 12:26    Procedures Procedures (including critical care time)  Medications Ordered in ED Medications  oseltamivir (TAMIFLU) capsule 75 mg (not administered)  HYDROcodone-acetaminophen (NORCO/VICODIN) 5-325 MG per tablet 1 tablet (not administered)  ondansetron (ZOFRAN-ODT) disintegrating tablet 4 mg (not administered)     Initial Impression / Assessment and Plan / ED Course  I have reviewed the triage vital signs and the nursing notes.  Pertinent labs & imaging results that were available during my care of the patient were reviewed by me and considered in my medical decision making (see chart for details).     Vitals:   07/21/16 1044 07/21/16 1116 07/21/16 1148 07/21/16 1253  BP: 114/84  115/69 115/72  Pulse: (!) 9 81 83 85  Resp: 17  16 16   Temp:   99.2 F (37.3 C)   TempSrc:   Oral   SpO2: 96%  98% 97%  Weight:      Height:        Medications  oseltamivir (TAMIFLU) capsule 75 mg (75 mg Oral Given 07/21/16 1252)  HYDROcodone-acetaminophen (NORCO/VICODIN) 5-325 MG per tablet 1 tablet (1 tablet Oral Given 07/21/16 1252)  ondansetron (ZOFRAN-ODT) disintegrating tablet 4 mg (4 mg Oral Given 07/21/16 1252)    Mario Jordan is 31 y.o. male presenting with Upper respiratory symptoms pleuritic chest pain and cough. Lung sounds are clear with excellent air movement, patient afebrile and well-appearing. Chest x-rays without infiltrate. Likely influenza. Patient started on Tamiflu and work note provided. Extensive discussion of return precautions and patient verbalizes understanding and teach back technique.  Evaluation does not show pathology that would require ongoing emergent intervention or inpatient treatment. Pt is hemodynamically stable and mentating  appropriately. Discussed findings and plan with patient/guardian, who agrees with care plan. All questions answered. Return precautions discussed and outpatient follow up given.      Final Clinical Impressions(s) / ED Diagnoses   Final diagnoses:  Influenza-like illness    New Prescriptions New Prescriptions   HYDROCODONE-ACETAMINOPHEN (NORCO/VICODIN) 5-325 MG TABLET    Take 1-2 tablets by mouth every 6 hours as needed for pain and/or cough.   OSELTAMIVIR (TAMIFLU) 75 MG CAPSULE    Take 1 capsule (75 mg total) by mouth every 12 (twelve) hours.     Wynetta Emeryicole Julisa Flippo, PA-C 07/21/16 1256    Doug SouSam Jacubowitz, MD 07/21/16 1710

## 2016-07-21 NOTE — Discharge Instructions (Signed)

## 2017-05-19 ENCOUNTER — Other Ambulatory Visit: Payer: Self-pay

## 2017-05-19 ENCOUNTER — Emergency Department (HOSPITAL_COMMUNITY): Payer: Self-pay

## 2017-05-19 ENCOUNTER — Encounter (HOSPITAL_COMMUNITY): Payer: Self-pay | Admitting: Emergency Medicine

## 2017-05-19 ENCOUNTER — Inpatient Hospital Stay (HOSPITAL_COMMUNITY)
Admission: EM | Admit: 2017-05-19 | Discharge: 2017-05-21 | DRG: 504 | Disposition: A | Discharge status: Home / Self Care | Payer: Self-pay | Attending: Orthopedic Surgery | Admitting: Orthopedic Surgery

## 2017-05-19 DIAGNOSIS — J45909 Unspecified asthma, uncomplicated: Secondary | ICD-10-CM | POA: Diagnosis present

## 2017-05-19 DIAGNOSIS — S92001A Unspecified fracture of right calcaneus, initial encounter for closed fracture: Secondary | ICD-10-CM | POA: Diagnosis present

## 2017-05-19 DIAGNOSIS — Y9259 Other trade areas as the place of occurrence of the external cause: Secondary | ICD-10-CM

## 2017-05-19 DIAGNOSIS — S92009A Unspecified fracture of unspecified calcaneus, initial encounter for closed fracture: Secondary | ICD-10-CM | POA: Diagnosis present

## 2017-05-19 DIAGNOSIS — S92011A Displaced fracture of body of right calcaneus, initial encounter for closed fracture: Principal | ICD-10-CM | POA: Diagnosis present

## 2017-05-19 DIAGNOSIS — W134XXA Fall from, out of or through window, initial encounter: Secondary | ICD-10-CM | POA: Diagnosis present

## 2017-05-19 DIAGNOSIS — D62 Acute posthemorrhagic anemia: Secondary | ICD-10-CM | POA: Diagnosis not present

## 2017-05-19 DIAGNOSIS — F17200 Nicotine dependence, unspecified, uncomplicated: Secondary | ICD-10-CM | POA: Diagnosis present

## 2017-05-19 DIAGNOSIS — T1490XA Injury, unspecified, initial encounter: Secondary | ICD-10-CM

## 2017-05-19 DIAGNOSIS — F1910 Other psychoactive substance abuse, uncomplicated: Secondary | ICD-10-CM | POA: Diagnosis present

## 2017-05-19 DIAGNOSIS — F1590 Other stimulant use, unspecified, uncomplicated: Secondary | ICD-10-CM | POA: Diagnosis present

## 2017-05-19 DIAGNOSIS — F1290 Cannabis use, unspecified, uncomplicated: Secondary | ICD-10-CM | POA: Diagnosis present

## 2017-05-19 DIAGNOSIS — F17210 Nicotine dependence, cigarettes, uncomplicated: Secondary | ICD-10-CM | POA: Diagnosis present

## 2017-05-19 DIAGNOSIS — Z419 Encounter for procedure for purposes other than remedying health state, unspecified: Secondary | ICD-10-CM

## 2017-05-19 DIAGNOSIS — T148XXA Other injury of unspecified body region, initial encounter: Secondary | ICD-10-CM

## 2017-05-19 DIAGNOSIS — F1490 Cocaine use, unspecified, uncomplicated: Secondary | ICD-10-CM | POA: Diagnosis present

## 2017-05-19 HISTORY — DX: Nicotine dependence, unspecified, uncomplicated: F17.200

## 2017-05-19 HISTORY — DX: Other psychoactive substance abuse, uncomplicated: F19.10

## 2017-05-19 HISTORY — DX: Cannabis use, unspecified, uncomplicated: F12.90

## 2017-05-19 HISTORY — DX: Unspecified asthma, uncomplicated: J45.909

## 2017-05-19 LAB — BASIC METABOLIC PANEL
ANION GAP: 11 (ref 5–15)
BUN: 7 mg/dL (ref 6–20)
CHLORIDE: 101 mmol/L (ref 101–111)
CO2: 25 mmol/L (ref 22–32)
Calcium: 9 mg/dL (ref 8.9–10.3)
Creatinine, Ser: 0.94 mg/dL (ref 0.61–1.24)
GFR calc Af Amer: >60 mL/min (ref >60)
GLUCOSE: 61 mg/dL — AB (ref 65–99)
POTASSIUM: 3.7 mmol/L (ref 3.5–5.1)
Sodium: 137 mmol/L (ref 135–145)

## 2017-05-19 LAB — CBC
HEMATOCRIT: 39 % (ref 39.0–52.0)
HEMOGLOBIN: 13.6 g/dL (ref 13.0–17.0)
MCH: 33.3 pg (ref 26.0–34.0)
MCHC: 34.9 g/dL (ref 30.0–36.0)
MCV: 95.6 fL (ref 78.0–100.0)
PLATELETS: 223 10*3/uL (ref 150–400)
RBC: 4.08 MIL/uL — AB (ref 4.22–5.81)
RDW: 12.2 % (ref 11.5–15.5)
WBC: 12.1 10*3/uL — AB (ref 4.0–10.5)

## 2017-05-19 LAB — ETHANOL: ALCOHOL ETHYL (B): 80 mg/dL — AB (ref <10)

## 2017-05-19 LAB — RAPID URINE DRUG SCREEN, HOSP PERFORMED
AMPHETAMINES: NOT DETECTED
BENZODIAZEPINES: NOT DETECTED
Barbiturates: NOT DETECTED
Cocaine: POSITIVE — AB
Opiates: NOT DETECTED
TETRAHYDROCANNABINOL: POSITIVE — AB

## 2017-05-19 MED ORDER — HYDROMORPHONE HCL 1 MG/ML IJ SOLN
INTRAMUSCULAR | Status: AC
  Filled 2017-05-19: qty 1

## 2017-05-19 MED ORDER — ONDANSETRON HCL 4 MG/2ML IJ SOLN
4.0000 mg | Freq: Three times a day (TID) | INTRAMUSCULAR | Status: DC | PRN
  Administered 2017-05-20 (×2): 4 mg via INTRAVENOUS
  Filled 2017-05-19 (×2): qty 2

## 2017-05-19 MED ORDER — HYDROMORPHONE HCL 1 MG/ML IJ SOLN: 0.5000 mg | INTRAMUSCULAR | Status: DC | PRN

## 2017-05-19 MED ORDER — HYDROMORPHONE HCL 1 MG/ML IJ SOLN
1.0000 mg | Freq: Once | INTRAMUSCULAR | Status: AC
  Administered 2017-05-19: 1 mg via INTRAVENOUS

## 2017-05-19 MED ORDER — FENTANYL CITRATE (PF) 100 MCG/2ML IJ SOLN
INTRAMUSCULAR | Status: AC
  Filled 2017-05-19: qty 2

## 2017-05-19 MED ORDER — SODIUM CHLORIDE 0.9 % IV SOLN
INTRAVENOUS | Status: AC
  Administered 2017-05-20: via INTRAVENOUS

## 2017-05-19 MED ORDER — FENTANYL CITRATE (PF) 100 MCG/2ML IJ SOLN
50.0000 ug | Freq: Once | INTRAMUSCULAR | Status: AC
  Administered 2017-05-19: 50 ug via INTRAVENOUS

## 2017-05-19 NOTE — ED Notes (Signed)
Paged ortho tech 

## 2017-05-19 NOTE — Progress Notes (Signed)
Chaplain was paged for a level 2 2 story fall.Pt was alert and talkative. Chaplain prayed with the Pt and asked if he needed anything and he said no. Chaplain left.    05/19/17 2100  Clinical Encounter Type  Visited With Patient  Visit Type Initial;Spiritual support  Referral From Care management  Spiritual Encounters  Spiritual Needs Prayer  Stress Factors  Patient Stress Factors Health changes

## 2017-05-19 NOTE — ED Notes (Signed)
Patient taken to xray and CT.

## 2017-05-19 NOTE — ED Notes (Signed)
plz have patient call his mom, Dustin FolksSally Olund @ [phone]

## 2017-05-19 NOTE — ED Triage Notes (Signed)
Patient jumped out of 2nd story window of a hotel approx 14 feet.  He landed on his feet, heard a "pop" in his right ankle.  Patient now with swelling and deformity of the right ankle/heel.  CSMTs and pulses intact, No loc, full recall.  Patient with c collar on at this time.

## 2017-05-19 NOTE — ED Provider Notes (Signed)
[facility] Provider Note   CSN: [id number] Arrival date & time: 05/19/17  2040     History   Chief Complaint Chief Complaint  Patient presents with  . Trauma    HPI Mario Jordan is a 31 y.o. male.  The history is provided by the patient and the EMS personnel.  Ankle Pain   The incident occurred less than 1 hour ago. The incident occurred at home. Injury mechanism: Jumped from 2nd story & landed on feet. Pain location: right ankle. Quality: Unable to specify. The pain is severe. The pain has been constant since onset. Associated symptoms include inability to bear weight. Pertinent negatives include no numbness, no loss of motion, no loss of sensation and no tingling. He reports no foreign bodies present. Exacerbated by: movement, bearing weight.    No past medical history on file.  There are no active problems to display for this patient.   History reviewed. No pertinent surgical history.     Home Medications    Prior to Admission medications   Not on File    Family History No family history on file.  Social History Social History   Tobacco Use  . Smoking status: Not on file  Substance Use Topics  . Alcohol use: Not on file  . Drug use: Not on file     Allergies   Patient has no allergy information on record.   Review of Systems Review of Systems  Constitutional: Negative for chills and fever.  HENT: Negative for ear pain and sore throat.   Eyes: Negative for visual disturbance.  Respiratory: Negative for cough and shortness of breath.   Cardiovascular: Negative for chest pain and palpitations.  Gastrointestinal: Negative for abdominal pain, nausea and vomiting.  Musculoskeletal: Positive for arthralgias and gait problem. Negative for back pain and neck pain.  Skin: Negative for color change and rash.  Allergic/Immunologic: Negative for immunocompromised state.  Neurological: Negative for tingling,  seizures, syncope, weakness and numbness.  Psychiatric/Behavioral: Negative for confusion.  All other systems reviewed and are negative.    Physical Exam Updated Vital Signs BP 118/88 (BP Location: Left Arm)   Pulse (!) 113   Temp 99.4 F (37.4 C) (Oral)   Resp 18   Ht 5\' 6"  (1.676 m)   Wt 63.5 kg (140 lb)   SpO2 100%   BMI 22.60 kg/m   Physical Exam  Constitutional: He appears well-developed and well-nourished.  HENT:  Head: Normocephalic and atraumatic.  Eyes: Conjunctivae are normal.  Neck: Neck supple.  Cardiovascular: Regular rhythm.  No murmur heard. Tachycardic  Pulmonary/Chest: Effort normal and breath sounds normal. No respiratory distress.  Abdominal: Soft. There is no tenderness.  Musculoskeletal: He exhibits deformity. He exhibits no edema.  Swelling and deformity of right hindfoot, symmetric DP pulses, decreased ROM at right ankle 2/2 pain; pelvis stable to AP compression w/o pain, no midline thoracic/lumbar TTP or step-offs  Neurological: He is alert.  Skin: Skin is warm and dry.  Abrasions to right knee  Psychiatric: He has a normal mood and affect.  Nursing note and vitals reviewed.    ED Treatments / Results  Labs (all labs ordered are listed, but only abnormal results are displayed) Labs Reviewed  CBC - Abnormal; Notable for the following components:      Result Value   WBC 12.1 (*)    RBC 4.08 (*)    All other components within normal limits  BASIC METABOLIC PANEL - Abnormal;  Notable for the following components:   Glucose, Bld 61 (*)    All other components within normal limits  ETHANOL - Abnormal; Notable for the following components:   Alcohol, Ethyl (B) 80 (*)    All other components within normal limits  RAPID URINE DRUG SCREEN, HOSP PERFORMED    EKG  EKG Interpretation None       Radiology Dg Lumbar Spine 2-3 Views  Result Date: 05/19/2017 CLINICAL DATA:  Jumped from second story.  Calcaneal fracture. EXAM: LUMBAR SPINE -  2-3 VIEW COMPARISON:  None. FINDINGS: Minimal rightward curvature upper lumbar spine, alignment is otherwise maintained. Vertebral body heights are normal. There is no listhesis. The posterior elements are intact. Disc spaces are preserved. No fracture. Sacroiliac joints are symmetric and normal. IMPRESSION: No acute fracture or subluxation of the lumbar spine. Mild rightward curvature may be positional or scoliosis. Electronically Signed   By: Rubye Oaks M.D.   On: 05/19/2017 22:00   Dg Tibia/fibula Left  Result Date: 05/19/2017 CLINICAL DATA:  Trauma, jumped from second story building. EXAM: LEFT TIBIA AND FIBULA - 2 VIEW COMPARISON:  None. FINDINGS: There is no evidence of fracture or other focal bone lesions. Soft tissues are unremarkable. IMPRESSION: Negative radiographs of the left tibia/fibula. Electronically Signed   By: Rubye Oaks M.D.   On: 05/19/2017 22:01   Dg Tibia/fibula Right  Result Date: 05/19/2017 CLINICAL DATA:  Trauma, jumped from second story building. EXAM: RIGHT TIBIA AND FIBULA - 2 VIEW COMPARISON:  None. FINDINGS: Calcaneal fracture better assessed on dedicated foot radiographs. No fracture of the tibia or fibula. The alignment is maintained. No focal lower lobe soft tissue abnormality. IMPRESSION: Calcaneal fracture.  No additional fracture of the right lower leg. Electronically Signed   By: Rubye Oaks M.D.   On: 05/19/2017 22:01   Ct Head Wo Contrast  Result Date: 05/19/2017 CLINICAL DATA:  Trauma, jumped from second story window. EXAM: CT HEAD WITHOUT CONTRAST CT CERVICAL SPINE WITHOUT CONTRAST TECHNIQUE: Multidetector CT imaging of the head and cervical spine was performed following the standard protocol without intravenous contrast. Multiplanar CT image reconstructions of the cervical spine were also generated. COMPARISON:  None. FINDINGS: CT HEAD FINDINGS Brain: No intracranial hemorrhage, mass effect, or midline shift. No hydrocephalus. The basilar  cisterns are patent. No evidence of territorial infarct or acute ischemia. No extra-axial or intracranial fluid collection. Vascular: No hyperdense vessel or unexpected calcification. Skull: No fracture or focal lesion. Sinuses/Orbits: Trace mucosal thickening of ethmoid air cells and left frontal sinus. No sinus fluid levels. Mastoid air cells are clear. Visualized orbits are unremarkable. Other: None. CT CERVICAL SPINE FINDINGS Alignment: Normal. Skull base and vertebrae: No acute fracture. Vertebral body heights are maintained. The dens and skull base are intact. Soft tissues and spinal canal: No prevertebral fluid or swelling. No visible canal hematoma. Disc levels:  Normal. Upper chest: Negative. Other: None. IMPRESSION: Normal CT of the head and cervical spine. No acute traumatic injury. Electronically Signed   By: Rubye Oaks M.D.   On: 05/19/2017 22:22   Ct Cervical Spine Wo Contrast  Result Date: 05/19/2017 CLINICAL DATA:  Trauma, jumped from second story window. EXAM: CT HEAD WITHOUT CONTRAST CT CERVICAL SPINE WITHOUT CONTRAST TECHNIQUE: Multidetector CT imaging of the head and cervical spine was performed following the standard protocol without intravenous contrast. Multiplanar CT image reconstructions of the cervical spine were also generated. COMPARISON:  None. FINDINGS: CT HEAD FINDINGS Brain: No intracranial hemorrhage, mass effect, or midline  shift. No hydrocephalus. The basilar cisterns are patent. No evidence of territorial infarct or acute ischemia. No extra-axial or intracranial fluid collection. Vascular: No hyperdense vessel or unexpected calcification. Skull: No fracture or focal lesion. Sinuses/Orbits: Trace mucosal thickening of ethmoid air cells and left frontal sinus. No sinus fluid levels. Mastoid air cells are clear. Visualized orbits are unremarkable. Other: None. CT CERVICAL SPINE FINDINGS Alignment: Normal. Skull base and vertebrae: No acute fracture. Vertebral body heights  are maintained. The dens and skull base are intact. Soft tissues and spinal canal: No prevertebral fluid or swelling. No visible canal hematoma. Disc levels:  Normal. Upper chest: Negative. Other: None. IMPRESSION: Normal CT of the head and cervical spine. No acute traumatic injury. Electronically Signed   By: Rubye OaksMelanie  Ehinger M.D.   On: 05/19/2017 22:22   Dg Pelvis Portable  Result Date: 05/19/2017 CLINICAL DATA:  Trauma, jumped out of window EXAM: PORTABLE PELVIS 1-2 VIEWS COMPARISON:  None. FINDINGS: There is no evidence of pelvic fracture or diastasis. No pelvic bone lesions are seen. IMPRESSION: Negative. Electronically Signed   By: Jasmine PangKim  Fujinaga M.D.   On: 05/19/2017 21:34   Dg Chest Portable 1 View  Result Date: 05/19/2017 CLINICAL DATA:  Trauma EXAM: PORTABLE CHEST 1 VIEW COMPARISON:  None. FINDINGS: The heart size and mediastinal contours are within normal limits. Both lungs are clear. The visualized skeletal structures are unremarkable. IMPRESSION: No active disease. Electronically Signed   By: Jasmine PangKim  Fujinaga M.D.   On: 05/19/2017 21:35   Dg Foot 2 Views Left  Result Date: 05/19/2017 CLINICAL DATA:  Trauma, jumped from second story building. EXAM: LEFT FOOT - 2 VIEW COMPARISON:  None. FINDINGS: There is no evidence of fracture or dislocation. Particularly, calcaneus is intact. There is no evidence of arthropathy or other focal bone abnormality. Soft tissues are unremarkable. IMPRESSION: Negative radiographs of the left foot. Electronically Signed   By: Rubye OaksMelanie  Ehinger M.D.   On: 05/19/2017 22:02   Dg Foot Complete Right  Result Date: 05/19/2017 CLINICAL DATA:  Deformity after jumping off second story. Right foot pain. EXAM: RIGHT FOOT COMPLETE - 3+ VIEW COMPARISON:  None. FINDINGS: Calcaneal fracture with significant displacement, 2.7 cm distraction of posterior cortex. Fracture extends just to the posterior subtalar joint, minimal comminution. No additional acute fracture of the foot.  The alignment is otherwise maintained. IMPRESSION: Displaced calcaneal fracture with 2.7 cm osseous distraction. Electronically Signed   By: Rubye OaksMelanie  Ehinger M.D.   On: 05/19/2017 21:59    Procedures Procedures (including critical care time)  Medications Ordered in ED Medications  fentaNYL (SUBLIMAZE) injection 50 mcg (not administered)     Initial Impression / Assessment and Plan / ED Course  I have reviewed the triage vital signs and the nursing notes.  Pertinent labs & imaging results that were available during my care of the patient were reviewed by me and considered in my medical decision making (see chart for details).     Pt presents w/right foot/ankle pain after jumping out of a 2nd story window. Was reportedly roughhousing with friends, jumped out, and heard a "pop" in his right ankle; Pt did not sustain any head/neck trauma, but was unable to bear weight on the RLE. Pt endorses drinking some alcohol tonight, but denies any other injuries.  VS & exam as above. Analgesia provided. CT head w/NAICA. CT c-spine w/o fracture or malalignment. XR with displaced right calcaneal fracture.  Orthopedics consulted by phone; recommending bulky Jones dressing and admission to their service for  future definitive repair.  Final Clinical Impressions(s) / ED Diagnoses   Final diagnoses:  Trauma  Closed displaced fracture of body of right calcaneus, initial encounter    ED Discharge Orders    None       Forest Becker, MD 05/19/17 2322    Margarita Grizzle, MD 05/20/17 0021

## 2017-05-20 ENCOUNTER — Other Ambulatory Visit: Payer: Self-pay

## 2017-05-20 ENCOUNTER — Inpatient Hospital Stay (HOSPITAL_COMMUNITY): Payer: Self-pay | Admitting: Anesthesiology

## 2017-05-20 ENCOUNTER — Encounter (HOSPITAL_COMMUNITY): Payer: Self-pay | Admitting: *Deleted

## 2017-05-20 ENCOUNTER — Inpatient Hospital Stay (HOSPITAL_COMMUNITY): Payer: Self-pay

## 2017-05-20 ENCOUNTER — Encounter (HOSPITAL_COMMUNITY)
Admission: EM | Disposition: A | Discharge status: Home / Self Care | Payer: Self-pay | Source: Home / Self Care | Attending: Orthopedic Surgery

## 2017-05-20 DIAGNOSIS — S92001A Unspecified fracture of right calcaneus, initial encounter for closed fracture: Secondary | ICD-10-CM | POA: Diagnosis present

## 2017-05-20 HISTORY — PX: ORIF CALCANEOUS FRACTURE: SHX5030

## 2017-05-20 LAB — CREATININE, SERUM
CREATININE: 0.89 mg/dL (ref 0.61–1.24)
GFR calc non Af Amer: >60 mL/min (ref >60)

## 2017-05-20 LAB — CBC
HCT: 36.9 % — ABNORMAL LOW (ref 39.0–52.0)
HEMOGLOBIN: 12.5 g/dL — AB (ref 13.0–17.0)
MCH: 33.2 pg (ref 26.0–34.0)
MCHC: 33.9 g/dL (ref 30.0–36.0)
MCV: 97.9 fL (ref 78.0–100.0)
PLATELETS: 200 10*3/uL (ref 150–400)
RBC: 3.77 MIL/uL — AB (ref 4.22–5.81)
RDW: 12.5 % (ref 11.5–15.5)
WBC: 10.5 10*3/uL (ref 4.0–10.5)

## 2017-05-20 SURGERY — OPEN REDUCTION INTERNAL FIXATION (ORIF) CALCANEOUS FRACTURE
Anesthesia: General | Laterality: Right

## 2017-05-20 MED ORDER — OXYCODONE HCL 5 MG PO TABS
10.0000 mg | ORAL_TABLET | ORAL | Status: DC | PRN
  Administered 2017-05-20 – 2017-05-21 (×4): 10 mg via ORAL
  Filled 2017-05-20 (×4): qty 2

## 2017-05-20 MED ORDER — METOCLOPRAMIDE HCL 5 MG PO TABS: 5.0000 mg | ORAL_TABLET | Freq: Three times a day (TID) | ORAL | Status: DC | PRN

## 2017-05-20 MED ORDER — ACETAMINOPHEN 325 MG PO TABS: 650.0000 mg | ORAL_TABLET | Freq: Four times a day (QID) | ORAL | Status: DC | PRN

## 2017-05-20 MED ORDER — FENTANYL CITRATE (PF) 250 MCG/5ML IJ SOLN
INTRAMUSCULAR | Status: AC
  Filled 2017-05-20: qty 5

## 2017-05-20 MED ORDER — OXYCODONE HCL 5 MG PO TABS: 5.0000 mg | ORAL_TABLET | Freq: Once | ORAL | Status: DC | PRN

## 2017-05-20 MED ORDER — OXYCODONE HCL 5 MG PO TABS: 5.0000 mg | ORAL_TABLET | ORAL | Status: DC | PRN

## 2017-05-20 MED ORDER — METHOCARBAMOL 1000 MG/10ML IJ SOLN
500.0000 mg | Freq: Four times a day (QID) | INTRAVENOUS | Status: DC
  Filled 2017-05-20 (×6): qty 5

## 2017-05-20 MED ORDER — ACETAMINOPHEN 650 MG RE SUPP: 650.0000 mg | Freq: Four times a day (QID) | RECTAL | Status: DC | PRN

## 2017-05-20 MED ORDER — METHOCARBAMOL 750 MG PO TABS
750.0000 mg | ORAL_TABLET | Freq: Four times a day (QID) | ORAL | Status: DC
  Administered 2017-05-20 – 2017-05-21 (×4): 750 mg via ORAL
  Filled 2017-05-20 (×3): qty 1

## 2017-05-20 MED ORDER — FENTANYL CITRATE (PF) 100 MCG/2ML IJ SOLN
INTRAMUSCULAR | Status: DC | PRN
Start: 2017-05-20 — End: 2017-05-20
  Administered 2017-05-20 (×2): 100 ug via INTRAVENOUS
  Administered 2017-05-20: 50 ug via INTRAVENOUS

## 2017-05-20 MED ORDER — 0.9 % SODIUM CHLORIDE (POUR BTL) OPTIME
TOPICAL | Status: DC | PRN
  Administered 2017-05-20: 1000 mL

## 2017-05-20 MED ORDER — LACTATED RINGERS IV SOLN
INTRAVENOUS | Status: DC
  Administered 2017-05-20 (×3): via INTRAVENOUS

## 2017-05-20 MED ORDER — ENOXAPARIN SODIUM 40 MG/0.4ML ~~LOC~~ SOLN
40.0000 mg | SUBCUTANEOUS | Status: DC
  Administered 2017-05-21: 40 mg via SUBCUTANEOUS
  Filled 2017-05-20: qty 0.4

## 2017-05-20 MED ORDER — ROCURONIUM BROMIDE 100 MG/10ML IV SOLN
INTRAVENOUS | Status: DC | PRN
  Administered 2017-05-20: 30 mg via INTRAVENOUS
  Administered 2017-05-20: 50 mg via INTRAVENOUS

## 2017-05-20 MED ORDER — MEPERIDINE HCL 25 MG/ML IJ SOLN: 6.2500 mg | INTRAMUSCULAR | Status: DC | PRN

## 2017-05-20 MED ORDER — LIDOCAINE HCL (CARDIAC) 20 MG/ML IV SOLN
INTRAVENOUS | Status: DC | PRN
  Administered 2017-05-20: 80 mg via INTRAVENOUS

## 2017-05-20 MED ORDER — METHOCARBAMOL 1000 MG/10ML IJ SOLN: 500.0000 mg | Freq: Four times a day (QID) | INTRAVENOUS | Status: DC | PRN

## 2017-05-20 MED ORDER — PROMETHAZINE HCL 25 MG/ML IJ SOLN
12.5000 mg | Freq: Once | INTRAMUSCULAR | Status: AC
  Administered 2017-05-20: 12.5 mg via INTRAVENOUS
  Filled 2017-05-20: qty 1

## 2017-05-20 MED ORDER — METOCLOPRAMIDE HCL 5 MG/ML IJ SOLN: 5.0000 mg | Freq: Three times a day (TID) | INTRAMUSCULAR | Status: DC | PRN

## 2017-05-20 MED ORDER — POTASSIUM CHLORIDE IN NACL 20-0.9 MEQ/L-% IV SOLN
INTRAVENOUS | Status: DC
  Administered 2017-05-20: 18:00:00 via INTRAVENOUS
  Filled 2017-05-20: qty 1000

## 2017-05-20 MED ORDER — CEFAZOLIN SODIUM-DEXTROSE 2-4 GM/100ML-% IV SOLN
2.0000 g | INTRAVENOUS | Status: AC
  Administered 2017-05-20: 2 g via INTRAVENOUS
  Filled 2017-05-20: qty 100

## 2017-05-20 MED ORDER — CHLORHEXIDINE GLUCONATE 4 % EX LIQD
60.0000 mL | Freq: Once | CUTANEOUS | Status: DC
  Filled 2017-05-20: qty 60

## 2017-05-20 MED ORDER — PHENYLEPHRINE 40 MCG/ML (10ML) SYRINGE FOR IV PUSH (FOR BLOOD PRESSURE SUPPORT)
PREFILLED_SYRINGE | INTRAVENOUS | Status: AC
  Filled 2017-05-20: qty 20

## 2017-05-20 MED ORDER — ONDANSETRON HCL 4 MG/2ML IJ SOLN: 4.0000 mg | Freq: Four times a day (QID) | INTRAMUSCULAR | Status: DC | PRN

## 2017-05-20 MED ORDER — SODIUM CHLORIDE 0.9 % IV SOLN
INTRAVENOUS | Status: DC
  Administered 2017-05-20: 08:00:00 via INTRAVENOUS

## 2017-05-20 MED ORDER — PROPOFOL 10 MG/ML IV BOLUS
INTRAVENOUS | Status: AC
  Filled 2017-05-20: qty 20

## 2017-05-20 MED ORDER — OXYCODONE HCL 5 MG/5ML PO SOLN: 5.0000 mg | Freq: Once | ORAL | Status: DC | PRN

## 2017-05-20 MED ORDER — METOCLOPRAMIDE HCL 10 MG PO TABS: 5.0000 mg | ORAL_TABLET | Freq: Three times a day (TID) | ORAL | Status: DC | PRN

## 2017-05-20 MED ORDER — ONDANSETRON HCL 4 MG/2ML IJ SOLN
INTRAMUSCULAR | Status: DC | PRN
  Administered 2017-05-20: 4 mg via INTRAVENOUS

## 2017-05-20 MED ORDER — FENTANYL CITRATE (PF) 100 MCG/2ML IJ SOLN
100.0000 ug | Freq: Once | INTRAMUSCULAR | Status: AC
  Administered 2017-05-20: 100 ug via INTRAVENOUS
  Filled 2017-05-20 (×2): qty 2

## 2017-05-20 MED ORDER — HYDROMORPHONE HCL 1 MG/ML IJ SOLN: 0.2500 mg | INTRAMUSCULAR | Status: DC | PRN

## 2017-05-20 MED ORDER — POLYETHYLENE GLYCOL 3350 17 G PO PACK
17.0000 g | PACK | Freq: Every day | ORAL | Status: DC
  Filled 2017-05-20: qty 1

## 2017-05-20 MED ORDER — ACETAMINOPHEN 500 MG PO TABS
500.0000 mg | ORAL_TABLET | Freq: Four times a day (QID) | ORAL | Status: DC
  Administered 2017-05-20 – 2017-05-21 (×4): 500 mg via ORAL
  Filled 2017-05-20 (×3): qty 1

## 2017-05-20 MED ORDER — CELECOXIB 200 MG PO CAPS
200.0000 mg | ORAL_CAPSULE | Freq: Two times a day (BID) | ORAL | Status: DC
  Administered 2017-05-20 – 2017-05-21 (×2): 200 mg via ORAL
  Filled 2017-05-20 (×2): qty 1

## 2017-05-20 MED ORDER — BISACODYL 5 MG PO TBEC: 5.0000 mg | DELAYED_RELEASE_TABLET | Freq: Every day | ORAL | Status: DC | PRN

## 2017-05-20 MED ORDER — DOCUSATE SODIUM 100 MG PO CAPS
100.0000 mg | ORAL_CAPSULE | Freq: Two times a day (BID) | ORAL | Status: DC
  Administered 2017-05-20 – 2017-05-21 (×2): 100 mg via ORAL
  Filled 2017-05-20 (×2): qty 1

## 2017-05-20 MED ORDER — POVIDONE-IODINE 10 % EX SWAB: 2.0000 "application " | Freq: Once | CUTANEOUS | Status: DC

## 2017-05-20 MED ORDER — SENNA 8.6 MG PO TABS
1.0000 | ORAL_TABLET | Freq: Two times a day (BID) | ORAL | Status: DC
  Filled 2017-05-20 (×2): qty 1

## 2017-05-20 MED ORDER — HYDROMORPHONE HCL 1 MG/ML IJ SOLN
0.5000 mg | INTRAMUSCULAR | Status: DC | PRN
  Administered 2017-05-20 (×2): 2 mg via INTRAVENOUS
  Filled 2017-05-20 (×2): qty 2

## 2017-05-20 MED ORDER — ONDANSETRON HCL 4 MG PO TABS: 4.0000 mg | ORAL_TABLET | Freq: Four times a day (QID) | ORAL | Status: DC | PRN

## 2017-05-20 MED ORDER — EPHEDRINE 5 MG/ML INJ
INTRAVENOUS | Status: AC
  Filled 2017-05-20: qty 10

## 2017-05-20 MED ORDER — PROMETHAZINE HCL 25 MG/ML IJ SOLN: 6.2500 mg | INTRAMUSCULAR | Status: DC | PRN

## 2017-05-20 MED ORDER — MAGNESIUM CITRATE PO SOLN: 1.0000 | Freq: Once | ORAL | Status: DC | PRN

## 2017-05-20 MED ORDER — DIPHENHYDRAMINE HCL 12.5 MG/5ML PO ELIX: 12.5000 mg | ORAL_SOLUTION | ORAL | Status: DC | PRN

## 2017-05-20 MED ORDER — PHENYLEPHRINE HCL 10 MG/ML IJ SOLN
INTRAMUSCULAR | Status: DC | PRN
  Administered 2017-05-20 (×3): 80 ug via INTRAVENOUS

## 2017-05-20 MED ORDER — HYDROMORPHONE HCL 1 MG/ML IJ SOLN
1.0000 mg | INTRAMUSCULAR | Status: DC | PRN
  Administered 2017-05-20: 1 mg via INTRAVENOUS
  Filled 2017-05-20: qty 1

## 2017-05-20 MED ORDER — SUGAMMADEX SODIUM 200 MG/2ML IV SOLN
INTRAVENOUS | Status: DC | PRN
  Administered 2017-05-20: 127 mg via INTRAVENOUS

## 2017-05-20 MED ORDER — PROPOFOL 10 MG/ML IV BOLUS
INTRAVENOUS | Status: DC | PRN
  Administered 2017-05-20: 200 mg via INTRAVENOUS

## 2017-05-20 MED ORDER — MIDAZOLAM HCL 2 MG/2ML IJ SOLN
INTRAMUSCULAR | Status: AC
  Filled 2017-05-20: qty 2

## 2017-05-20 MED ORDER — METHOCARBAMOL 500 MG PO TABS: 500.0000 mg | ORAL_TABLET | Freq: Four times a day (QID) | ORAL | Status: DC | PRN

## 2017-05-20 MED ORDER — DOCUSATE SODIUM 100 MG PO CAPS: 100.0000 mg | ORAL_CAPSULE | Freq: Two times a day (BID) | ORAL | Status: DC

## 2017-05-20 MED ORDER — CEFAZOLIN SODIUM-DEXTROSE 1-4 GM/50ML-% IV SOLN
1.0000 g | Freq: Four times a day (QID) | INTRAVENOUS | Status: AC
  Administered 2017-05-20 – 2017-05-21 (×3): 1 g via INTRAVENOUS
  Filled 2017-05-20 (×3): qty 50

## 2017-05-20 MED ORDER — MIDAZOLAM HCL 5 MG/5ML IJ SOLN
INTRAMUSCULAR | Status: DC | PRN
  Administered 2017-05-20: 2 mg via INTRAVENOUS

## 2017-05-20 SURGICAL SUPPLY — 69 items
BANDAGE ACE 4X5 VEL STRL LF (GAUZE/BANDAGES/DRESSINGS) ×3 IMPLANT
BANDAGE ACE 6X5 VEL STRL LF (GAUZE/BANDAGES/DRESSINGS) ×3 IMPLANT
BANDAGE ESMARK 6X9 LF (GAUZE/BANDAGES/DRESSINGS) ×1 IMPLANT
BIT DRILL CANN 3.5MM (DRILL) IMPLANT
BLADE SURG 10 STRL SS (BLADE) ×3 IMPLANT
BNDG CMPR 9X6 STRL LF SNTH (GAUZE/BANDAGES/DRESSINGS) ×1
BNDG COHESIVE 4X5 TAN STRL (GAUZE/BANDAGES/DRESSINGS) ×3 IMPLANT
BNDG ESMARK 6X9 LF (GAUZE/BANDAGES/DRESSINGS) ×3
BNDG GAUZE ELAST 4 BULKY (GAUZE/BANDAGES/DRESSINGS) ×4 IMPLANT
BRUSH SCRUB SURG 4.25 DISP (MISCELLANEOUS) ×4 IMPLANT
COVER MAYO STAND STRL (DRAPES) ×1 IMPLANT
COVER SURGICAL LIGHT HANDLE (MISCELLANEOUS) ×4 IMPLANT
DRAIN TLS ROUND 10FR (DRAIN) ×1 IMPLANT
DRAPE C-ARM 42X72 X-RAY (DRAPES) ×3 IMPLANT
DRAPE C-ARMOR (DRAPES) ×3 IMPLANT
DRAPE ORTHO SPLIT 77X108 STRL (DRAPES) ×3
DRAPE SURG ORHT 6 SPLT 77X108 (DRAPES) ×1 IMPLANT
DRAPE U-SHAPE 47X51 STRL (DRAPES) ×1 IMPLANT
DRILL CANN 3.5MM (DRILL) ×3
DRSG EMULSION OIL 3X3 NADH (GAUZE/BANDAGES/DRESSINGS) ×1 IMPLANT
DRSG MEPITEL 4X7.2 (GAUZE/BANDAGES/DRESSINGS) ×2 IMPLANT
ELECT REM PT RETURN 9FT ADLT (ELECTROSURGICAL) ×3
ELECTRODE REM PT RTRN 9FT ADLT (ELECTROSURGICAL) ×1 IMPLANT
GAUZE SPONGE 4X4 12PLY STRL (GAUZE/BANDAGES/DRESSINGS) ×3 IMPLANT
GAUZE SPONGE 4X4 12PLY STRL LF (GAUZE/BANDAGES/DRESSINGS) ×2 IMPLANT
GLOVE BIO SURGEON STRL SZ7.5 (GLOVE) ×3 IMPLANT
GLOVE BIO SURGEON STRL SZ8 (GLOVE) ×3 IMPLANT
GLOVE BIOGEL PI IND STRL 7.5 (GLOVE) ×1 IMPLANT
GLOVE BIOGEL PI INDICATOR 7.5 (GLOVE) ×2
GOWN STRL REUS W/ TWL LRG LVL3 (GOWN DISPOSABLE) ×2 IMPLANT
GOWN STRL REUS W/ TWL XL LVL3 (GOWN DISPOSABLE) ×1 IMPLANT
GOWN STRL REUS W/TWL LRG LVL3 (GOWN DISPOSABLE) ×12
GOWN STRL REUS W/TWL XL LVL3 (GOWN DISPOSABLE) ×6
K-WIRE 1.8 (WIRE) ×18
K-WIRE FX200X1.8XTROC TIP (WIRE) ×6
KIT BASIN OR (CUSTOM PROCEDURE TRAY) ×3 IMPLANT
KIT ROOM TURNOVER OR (KITS) ×3 IMPLANT
KWIRE FX200X1.8XTROC TIP (WIRE) IMPLANT
MANIFOLD NEPTUNE II (INSTRUMENTS) ×1 IMPLANT
NDL HYPO 21X1.5 SAFETY (NEEDLE) IMPLANT
NEEDLE HYPO 21X1.5 SAFETY (NEEDLE) IMPLANT
NS IRRIG 1000ML POUR BTL (IV SOLUTION) ×3 IMPLANT
PACK ORTHO EXTREMITY (CUSTOM PROCEDURE TRAY) ×3 IMPLANT
PAD ABD 8X10 STRL (GAUZE/BANDAGES/DRESSINGS) ×2 IMPLANT
PAD ARMBOARD 7.5X6 YLW CONV (MISCELLANEOUS) ×4 IMPLANT
PAD CAST 4YDX4 CTTN HI CHSV (CAST SUPPLIES) ×2 IMPLANT
PADDING CAST COTTON 4X4 STRL (CAST SUPPLIES) ×3
PADDING CAST COTTON 6X4 STRL (CAST SUPPLIES) ×3 IMPLANT
PENCIL BUTTON HOLSTER BLD 10FT (ELECTRODE) ×1 IMPLANT
PIN GUIDE DRILL TIP 2.8X300 (DRILL) ×4 IMPLANT
SCREW CANN 5.0X40MM (Screw) ×4 IMPLANT
SCREW CANNULATED 5.0X42MM (Screw) ×4 IMPLANT
SCREW CANNULATED 5.0X44MM (Screw) ×2 IMPLANT
SPLINT PLASTER CAST XFAST 5X30 (CAST SUPPLIES) IMPLANT
SPLINT PLASTER XFAST SET 5X30 (CAST SUPPLIES) ×2
SPONGE LAP 18X18 X RAY DECT (DISPOSABLE) ×3 IMPLANT
SUCTION FRAZIER HANDLE 10FR (MISCELLANEOUS) ×2
SUCTION TUBE FRAZIER 10FR DISP (MISCELLANEOUS) ×1 IMPLANT
SUT ETHILON 3 0 PS 1 (SUTURE) ×6 IMPLANT
SUT VIC AB 2-0 CT3 27 (SUTURE) ×2 IMPLANT
SUT VIC AB 2-0 SH 18 (SUTURE) ×1 IMPLANT
SUT VIC AB 3-0 FS2 27 (SUTURE) ×1 IMPLANT
SYR CONTROL 10ML LL (SYRINGE) IMPLANT
TOWEL OR 17X24 6PK STRL BLUE (TOWEL DISPOSABLE) ×3 IMPLANT
TOWEL OR 17X26 10 PK STRL BLUE (TOWEL DISPOSABLE) ×6 IMPLANT
TUBE CONNECTING 12'X1/4 (SUCTIONS) ×1
TUBE CONNECTING 12X1/4 (SUCTIONS) ×2 IMPLANT
UNDERPAD 30X30 (UNDERPADS AND DIAPERS) ×3 IMPLANT
WATER STERILE IRR 1000ML POUR (IV SOLUTION) ×1 IMPLANT

## 2017-05-20 NOTE — Anesthesia Preprocedure Evaluation (Signed)
Anesthesia Evaluation  Patient identified by MRN, date of birth, ID band Patient awake    Reviewed: Allergy & Precautions, NPO status , Patient's Chart, lab work & pertinent test results  Airway Mallampati: II  TM Distance: >3 FB Neck ROM: Full    Dental no notable dental hx.    Pulmonary neg pulmonary ROS, Current Smoker,    Pulmonary exam normal breath sounds clear to auscultation       Cardiovascular negative cardio ROS Normal cardiovascular exam Rhythm:Regular Rate:Normal     Neuro/Psych negative neurological ROS  negative psych ROS   GI/Hepatic negative GI ROS, Neg liver ROS,   Endo/Other  negative endocrine ROS  Renal/GU negative Renal ROS  negative genitourinary   Musculoskeletal negative musculoskeletal ROS (+)   Abdominal   Peds negative pediatric ROS (+)  Hematology negative hematology ROS (+)   Anesthesia Other Findings   Reproductive/Obstetrics negative OB ROS                             Anesthesia Physical Anesthesia Plan  ASA: II  Anesthesia Plan: General   Post-op Pain Management:    Induction: Intravenous  PONV Risk Score and Plan: 1 and Ondansetron  Airway Management Planned: Oral ETT  Additional Equipment:   Intra-op Plan:   Post-operative Plan: Extubation in OR  Informed Consent: I have reviewed the patients History and Physical, chart, labs and discussed the procedure including the risks, benefits and alternatives for the proposed anesthesia with the patient or authorized representative who has indicated his/her understanding and acceptance.     Dental advisory given  Plan Discussed with: CRNA  Anesthesia Plan Comments:         Anesthesia Quick Evaluation  

## 2017-05-20 NOTE — ED Notes (Signed)
Family going home, will be back in the morning.

## 2017-05-20 NOTE — Progress Notes (Addendum)
Full consult to follow.  Given the location and complexity of the right calcaneus fracture, Dr. Linna CapriceSwinteck asserted this was outside his scope of practice and that it would be in the best interest of the patient to have these injuries evaluated and treated by a fellowship trained orthopaedic traumatologist. Consequently, I was consulted to provide further evaluation and management.  Displaced tongue type right calcaneus fracture with high risk for skin issues, including possibility of necrosis, with subsequent risk of infection and amputation, and will require emergent intervention today.  Efforts underway to get patient to the OR asap.   Mario GalasMichael Dayten Juba, MD [facility] [phone] (p)

## 2017-05-20 NOTE — Anesthesia Procedure Notes (Signed)
Procedure Name: Intubation Date/Time: 05/20/2017 1:31 PM Performed by: Roman Sandall T, CRNA Pre-anesthesia Checklist: Patient identified, Emergency Drugs available, Suction available and Patient being monitored Patient Re-evaluated:Patient Re-evaluated prior to induction Oxygen Delivery Method: Circle system utilized Preoxygenation: Pre-oxygenation with 100% oxygen Induction Type: IV induction Ventilation: Mask ventilation without difficulty Laryngoscope Size: Miller and 3 Grade View: Grade I Tube type: Oral Tube size: 7.5 mm Number of attempts: 1 Airway Equipment and Method: Patient positioned with wedge pillow and Stylet Placement Confirmation: ETT inserted through vocal cords under direct vision,  positive ETCO2 and breath sounds checked- equal and bilateral Secured at: 22 cm Tube secured with: Tape Dental Injury: Teeth and Oropharynx as per pre-operative assessment

## 2017-05-20 NOTE — Transfer of Care (Signed)
Immediate Anesthesia Transfer of Care Note  Patient: [name]  Procedure(s) Performed: OPEN REDUCTION INTERNAL FIXATION (ORIF) CALCANEOUS FRACTURE (Right )  Patient Location: PACU  Anesthesia Type:General  Level of Consciousness: awake and alert   Airway & Oxygen Therapy: Patient Spontanous Breathing and Patient connected to nasal cannula oxygen  Post-op Assessment: Report given to RN and Post -op Vital signs reviewed and stable  Post vital signs: Reviewed and stable  Last Vitals:  Vitals:   05/20/17 1537 05/20/17 1539  BP:  (!) 139/93  Pulse: 76 70  Resp:  18  Temp: 36.5 C   SpO2: 96% 100%    Last Pain:  Vitals:   05/20/17 1136  TempSrc:   PainSc: 5       Patients Stated Pain Goal: 3 (05/20/17 0749)  Complications: No apparent anesthesia complications

## 2017-05-20 NOTE — ED Notes (Signed)
RN paged Dr. Linna CapriceSwinteck to inform him that pt was experiencing nausea and emesis.  He ordered Phenergan 12.5 mg.  RN placed order.  Will continue to monitor

## 2017-05-20 NOTE — ED Notes (Signed)
MD advised for staff to give pt ice chips.

## 2017-05-20 NOTE — ED Notes (Signed)
Mario Jordan - GF - [phone].  Mom is Dustin FolksSally Vahey [phone]

## 2017-05-20 NOTE — ED Notes (Signed)
Ortho MD in w/pt.

## 2017-05-20 NOTE — Anesthesia Postprocedure Evaluation (Signed)
Anesthesia Post Note  Patient: Mario Jordan  Procedure(s) Performed: OPEN REDUCTION INTERNAL FIXATION (ORIF) CALCANEOUS FRACTURE (Right )     Patient location during evaluation: PACU Anesthesia Type: General Level of consciousness: awake and alert Pain management: pain level controlled Vital Signs Assessment: post-procedure vital signs reviewed and stable Respiratory status: spontaneous breathing, nonlabored ventilation and respiratory function stable Cardiovascular status: blood pressure returned to baseline and stable Postop Assessment: no apparent nausea or vomiting Anesthetic complications: no    Last Vitals:  Vitals:   05/20/17 1600 05/20/17 1615  BP:  131/82  Pulse: 76 74  Resp: 16 13  Temp:    SpO2: 100% 100%    Last Pain:  Vitals:   05/20/17 1615  TempSrc:   PainSc: Asleep                 Mario Jordan
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
## 2017-05-20 NOTE — ED Notes (Signed)
Pt complains of pain 9/10 unable to rest.  Provider paged and called back, RN took verbal orders.

## 2017-05-20 NOTE — ED Notes (Signed)
RN informed by Officer Little that pt is indeed in their custody. RN informed registration.  Pt is to receive no visitors however his GF is brings "whites" (white underwhere) to the hospital for him to wear in the jail. We can take them from her and place them in his property
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
## 2017-05-20 NOTE — Progress Notes (Signed)
Patient requested pain medication. Dr. Hyacinth MeekerMiller notified per Dr. Hyacinth MeekerMiller will hold for now as he is getting ready to go back.

## 2017-05-20 NOTE — Brief Op Note (Signed)
05/19/2017 - 05/20/2017  1:02 PM  PATIENT:  Mario Jordan  31 y.o. male  PRE-OPERATIVE DIAGNOSIS:  RIGHT DISPLACED TONGUE TYPE CALCANEUS FRACTURE  POST-OPERATIVE DIAGNOSIS:  RIGHT DISPLACED TONGUE TYPE CALCANEUS FRACTURE  PROCEDURE:  Procedure(s): OPEN REDUCTION INTERNAL FIXATION (ORIF) CALCANEUS FRACTURE (Right)  SURGEON:  Surgeon(s) and Role:    Myrene Galas* Dontavis Tschantz, MD - Primary  PHYSICIAN ASSISTANT: 1. KEITH PAUL, PA-C, 2. PA Student  ANESTHESIA:   general  EBL:  Minimal   BLOOD ADMINISTERED:none  DRAINS: none   LOCAL MEDICATIONS USED:  NONE  SPECIMEN:  No Specimen  DISPOSITION OF SPECIMEN:  N/A  COUNTS:  YES  TOURNIQUET:  * Missing tourniquet times found for documented tourniquets in log: [id number] *  DICTATION: .Other Dictation: Dictation Number [id number]  PLAN OF CARE: Admit to inpatient   PATIENT DISPOSITION:  PACU - hemodynamically stable.   Delay start of Pharmacological VTE agent (>24hrs) due to surgical blood loss or risk of bleeding: no

## 2017-05-20 NOTE — ED Notes (Addendum)
Belongings inventoried, one bag placed in locker #1  In ED.  Pt also had valuables (watch, two chains and 1 pendent) sent to security (bag number [id number]).

## 2017-05-20 NOTE — ED Notes (Signed)
Pt being transported to [facility] via stretcher. Consent form, Paperwork from [employer], EKG, belongings inventory sheets - including sheet for Security items, and Ancef sent w/pt.

## 2017-05-20 NOTE — ED Notes (Signed)
[employer] Officer w/pt d/t pt in police custody.

## 2017-05-20 NOTE — ED Notes (Signed)
Ask patient to call his fiance @ [phone]

## 2017-05-20 NOTE — Consult Note (Signed)
Orthopaedic Trauma Service (OTS) Consult   Patient ID: Mario Jordan MRN: [id number] DOB/AGE: 12-27-1985 31 y.o.   Reason for Consult: R calcaneus fracture  Referring Physician: Geralynn Rile, MD    HPI: Mario Jordan is an 31 y.o. black male who presented to the emergency department last night after jumping or falling from second story window at a hotel.  Patient states that his friends were playing a joke on him and told him that the cops were outside.  Patient went out the second story window and he landed on his feet, subsequently the cops were called and the patient was brought to [facility] for evaluation.  He was found to have an isolated right tongue type calcaneus fracture.  Orthopedics was consulted.  They instructed the ED to place patient in a bulky compressive dressing to protect his soft tissue in the right heel.  Due to the complexity of the injury orthopedic trauma service was consulted for definitive management.    Patient is seen and evaluated in F6, he complains only of right foot pain.  Denies any additional injuries elsewhere denies any numbness or tingling.  States he is not really sure why the window.  He has been up for 2 days by his report.  He did cocaine either yesterday or the day before.  He smokes marijuana regularly.  He states that he works in [employer].  He was homeless but is now living with his girlfriend   Did speak to the police officer outside patient's room and he states that they are currently present because patient has outstanding warrants not because of the current incident.   Jump was not a suicide attempt   Past medical history: Asthma Surgical history: Denies Allergies: No known drug allergies  Medications: Albuterol  States he was on ADHD medication about a year ago while he was in prison.  But has not been on that medication since he was released  Family history: Noncontributory  Social history: Smokes cigarettes  daily, smokes marijuana regularly uses cocaine and methamphetamines. Drinks alcohol regularly as well   History of prescription pill abuse.  Patient states that he prefers to use marijuana rather pills   Works in [employer] when he is able to get jobs   Results for orders placed or performed during the hospital encounter of 05/19/17 (from the past 48 hour(s))  CBC     Status: Abnormal   Collection Time: 05/19/17 10:13 PM  Result Value Ref Range   WBC 12.1 (H) 4.0 - 10.5 K/uL   RBC 4.08 (L) 4.22 - 5.81 MIL/uL   Hemoglobin 13.6 13.0 - 17.0 g/dL   HCT 39.0 39.0 - 52.0 %   MCV 95.6 78.0 - 100.0 fL   MCH 33.3 26.0 - 34.0 pg   MCHC 34.9 30.0 - 36.0 g/dL   RDW 12.2 11.5 - 15.5 %   Platelets 223 150 - 400 K/uL  Basic metabolic panel     Status: Abnormal   Collection Time: 05/19/17 10:13 PM  Result Value Ref Range   Sodium 137 135 - 145 mmol/L   Potassium 3.7 3.5 - 5.1 mmol/L   Chloride 101 101 - 111 mmol/L   CO2 25 22 - 32 mmol/L   Glucose, Bld 61 (L) 65 - 99 mg/dL   BUN 7 6 - 20 mg/dL   Creatinine, Ser 0.94 0.61 - 1.24 mg/dL   Calcium 9.0 8.9 - 10.3 mg/dL   GFR calc non Af Amer >60 >60 mL/min  GFR calc Af Amer >60 >60 mL/min    Comment: (NOTE) The eGFR has been calculated using the CKD EPI equation. This calculation has not been validated in all clinical situations. eGFR's persistently <60 mL/min signify possible Chronic Kidney Disease.    Anion gap 11 5 - 15  Ethanol     Status: Abnormal   Collection Time: 05/19/17 10:13 PM  Result Value Ref Range   Alcohol, Ethyl (B) 80 (H) <10 mg/dL    Comment:        LOWEST DETECTABLE LIMIT FOR SERUM ALCOHOL IS 10 mg/dL FOR MEDICAL PURPOSES ONLY   Rapid urine drug screen (hospital performed)     Status: Abnormal   Collection Time: 05/19/17 10:16 PM  Result Value Ref Range   Opiates NONE DETECTED NONE DETECTED   Cocaine POSITIVE (A) NONE DETECTED   Benzodiazepines NONE DETECTED NONE DETECTED   Amphetamines NONE DETECTED NONE  DETECTED   Tetrahydrocannabinol POSITIVE (A) NONE DETECTED   Barbiturates NONE DETECTED NONE DETECTED    Comment:        DRUG SCREEN FOR MEDICAL PURPOSES ONLY.  IF CONFIRMATION IS NEEDED FOR ANY PURPOSE, NOTIFY LAB WITHIN 5 DAYS.        LOWEST DETECTABLE LIMITS FOR URINE DRUG SCREEN Drug Class       Cutoff (ng/mL) Amphetamine      1000 Barbiturate      200 Benzodiazepine   854 Tricyclics       627 Opiates          300 Cocaine          300 THC              50     Dg Lumbar Spine 2-3 Views  Result Date: 05/19/2017 CLINICAL DATA:  Jumped from second story.  Calcaneal fracture. EXAM: LUMBAR SPINE - 2-3 VIEW COMPARISON:  None. FINDINGS: Minimal rightward curvature upper lumbar spine, alignment is otherwise maintained. Vertebral body heights are normal. There is no listhesis. The posterior elements are intact. Disc spaces are preserved. No fracture. Sacroiliac joints are symmetric and normal. IMPRESSION: No acute fracture or subluxation of the lumbar spine. Mild rightward curvature may be positional or scoliosis. Electronically Signed   By: Jeb Levering M.D.   On: 05/19/2017 22:00   Dg Tibia/fibula Left  Result Date: 05/19/2017 CLINICAL DATA:  Trauma, jumped from second story building. EXAM: LEFT TIBIA AND FIBULA - 2 VIEW COMPARISON:  None. FINDINGS: There is no evidence of fracture or other focal bone lesions. Soft tissues are unremarkable. IMPRESSION: Negative radiographs of the left tibia/fibula. Electronically Signed   By: Jeb Levering M.D.   On: 05/19/2017 22:01   Dg Tibia/fibula Right  Result Date: 05/19/2017 CLINICAL DATA:  Trauma, jumped from second story building. EXAM: RIGHT TIBIA AND FIBULA - 2 VIEW COMPARISON:  None. FINDINGS: Calcaneal fracture better assessed on dedicated foot radiographs. No fracture of the tibia or fibula. The alignment is maintained. No focal lower lobe soft tissue abnormality. IMPRESSION: Calcaneal fracture.  No additional fracture of the right  lower leg. Electronically Signed   By: Jeb Levering M.D.   On: 05/19/2017 22:01   Ct Head Wo Contrast  Result Date: 05/19/2017 CLINICAL DATA:  Trauma, jumped from second story window. EXAM: CT HEAD WITHOUT CONTRAST CT CERVICAL SPINE WITHOUT CONTRAST TECHNIQUE: Multidetector CT imaging of the head and cervical spine was performed following the standard protocol without intravenous contrast. Multiplanar CT image reconstructions of the cervical spine were also generated. COMPARISON:  None. FINDINGS: CT HEAD FINDINGS Brain: No intracranial hemorrhage, mass effect, or midline shift. No hydrocephalus. The basilar cisterns are patent. No evidence of territorial infarct or acute ischemia. No extra-axial or intracranial fluid collection. Vascular: No hyperdense vessel or unexpected calcification. Skull: No fracture or focal lesion. Sinuses/Orbits: Trace mucosal thickening of ethmoid air cells and left frontal sinus. No sinus fluid levels. Mastoid air cells are clear. Visualized orbits are unremarkable. Other: None. CT CERVICAL SPINE FINDINGS Alignment: Normal. Skull base and vertebrae: No acute fracture. Vertebral body heights are maintained. The dens and skull base are intact. Soft tissues and spinal canal: No prevertebral fluid or swelling. No visible canal hematoma. Disc levels:  Normal. Upper chest: Negative. Other: None. IMPRESSION: Normal CT of the head and cervical spine. No acute traumatic injury. Electronically Signed   By: Jeb Levering M.D.   On: 05/19/2017 22:22   Ct Cervical Spine Wo Contrast  Result Date: 05/19/2017 CLINICAL DATA:  Trauma, jumped from second story window. EXAM: CT HEAD WITHOUT CONTRAST CT CERVICAL SPINE WITHOUT CONTRAST TECHNIQUE: Multidetector CT imaging of the head and cervical spine was performed following the standard protocol without intravenous contrast. Multiplanar CT image reconstructions of the cervical spine were also generated. COMPARISON:  None. FINDINGS: CT HEAD  FINDINGS Brain: No intracranial hemorrhage, mass effect, or midline shift. No hydrocephalus. The basilar cisterns are patent. No evidence of territorial infarct or acute ischemia. No extra-axial or intracranial fluid collection. Vascular: No hyperdense vessel or unexpected calcification. Skull: No fracture or focal lesion. Sinuses/Orbits: Trace mucosal thickening of ethmoid air cells and left frontal sinus. No sinus fluid levels. Mastoid air cells are clear. Visualized orbits are unremarkable. Other: None. CT CERVICAL SPINE FINDINGS Alignment: Normal. Skull base and vertebrae: No acute fracture. Vertebral body heights are maintained. The dens and skull base are intact. Soft tissues and spinal canal: No prevertebral fluid or swelling. No visible canal hematoma. Disc levels:  Normal. Upper chest: Negative. Other: None. IMPRESSION: Normal CT of the head and cervical spine. No acute traumatic injury. Electronically Signed   By: Jeb Levering M.D.   On: 05/19/2017 22:22   Dg Pelvis Portable  Result Date: 05/19/2017 CLINICAL DATA:  Trauma, jumped out of window EXAM: PORTABLE PELVIS 1-2 VIEWS COMPARISON:  None. FINDINGS: There is no evidence of pelvic fracture or diastasis. No pelvic bone lesions are seen. IMPRESSION: Negative. Electronically Signed   By: Donavan Foil M.D.   On: 05/19/2017 21:34   Dg Chest Portable 1 View  Result Date: 05/19/2017 CLINICAL DATA:  Trauma EXAM: PORTABLE CHEST 1 VIEW COMPARISON:  None. FINDINGS: The heart size and mediastinal contours are within normal limits. Both lungs are clear. The visualized skeletal structures are unremarkable. IMPRESSION: No active disease. Electronically Signed   By: Donavan Foil M.D.   On: 05/19/2017 21:35   Dg Foot 2 Views Left  Result Date: 05/19/2017 CLINICAL DATA:  Trauma, jumped from second story building. EXAM: LEFT FOOT - 2 VIEW COMPARISON:  None. FINDINGS: There is no evidence of fracture or dislocation. Particularly, calcaneus is intact.  There is no evidence of arthropathy or other focal bone abnormality. Soft tissues are unremarkable. IMPRESSION: Negative radiographs of the left foot. Electronically Signed   By: Jeb Levering M.D.   On: 05/19/2017 22:02   Dg Foot Complete Right  Result Date: 05/19/2017 CLINICAL DATA:  Deformity after jumping off second story. Right foot pain. EXAM: RIGHT FOOT COMPLETE - 3+ VIEW COMPARISON:  None. FINDINGS: Calcaneal fracture with significant displacement, 2.7  cm distraction of posterior cortex. Fracture extends just to the posterior subtalar joint, minimal comminution. No additional acute fracture of the foot. The alignment is otherwise maintained. IMPRESSION: Displaced calcaneal fracture with 2.7 cm osseous distraction. Electronically Signed   By: Jeb Levering M.D.   On: 05/19/2017 21:59    Review of Systems  Constitutional: Negative for chills and fever.  Respiratory: Negative for shortness of breath and wheezing.   Cardiovascular: Negative for chest pain and palpitations.  Gastrointestinal: Negative for nausea and vomiting.  Musculoskeletal:       R Foot pain   Neurological: Negative for tingling and sensory change.   Blood pressure (!) 146/87, pulse 64, temperature 97.6 F (36.4 C), temperature source Oral, resp. rate 18, height '5\' 6"'$  (1.676 m), weight 63.5 kg (140 lb), SpO2 96 %. Physical Exam  Constitutional: He is oriented to person, place, and time. He is cooperative.  Older than stated age  Very fidgety  HENT:  Head: Normocephalic and atraumatic.  Scar left cheek   Neck: Normal range of motion and full passive range of motion without pain. No spinous process tenderness and no muscular tenderness present.  Cardiovascular: Normal rate, regular rhythm, S1 normal and S2 normal.  Pulmonary/Chest: Effort normal. No respiratory distress.  Abdominal:  Soft, NTND, + BS  Musculoskeletal:  Pelvis--no traumatic wounds or rash, no ecchymosis, stable to manual stress,  nontender  No lumbar or thoracic spine tenderness or step offs   Right Lower Extremity  Inspection: Short leg posterior splint in place No additional deformities noted to R leg Abrasion to R knee   Bony eval: Splint parted to eval soft tissue to posterior R foot Fracture fragment easily palpated along posterior soft tissues TTP R heel Foot is nontender Knee and ankle are nontender No pain with axial loading or log rolling of hip   Soft tissue:   Soft tissue posterior heel is stable. No evidence of soft tissue compromise/necrosis  Swelling is mild  No open wounds or lesions Small anterior knee abrasion  Hip unremarkable   Sensation: DPN, SPN, TN sensation intact  Motor: EHL, FHL, lesser toe motor functions intact Vascular: Ext warm  + DP pulse  Compartments are soft   Left Lower Extremity   No traumatic wounds, ecchymosis, or rash  Nontender  No knee or ankle effusion             Hip stable, no pain with axial loading or log rolling   Knee stable to varus/ valgus and anterior/posterior stress             Ankle stable with examination. No instability   Sens DPN, SPN, TN intact  Motor EHL, ext, flex, evers 5/5  DP 2+, PT 2+, No significant edema  Bilateral upper extremities  shoulder, elbow, wrist, digits- no skin wounds, nontender, no instability, no blocks to motion  Sens  Ax/R/M/U intact  Mot   Ax/ R/ PIN/ M/ AIN/ U intact  Rad 2+    Neurological: He is alert and oriented to person, place, and time.     Assessment/Plan:  31 y/o male s/p jump from second story window with R tongue type calcaneus fracture   -Jump from second story window  -Right tongue type calcaneus fracture with significant displacement  OR today for ORIF/percutaneous fixation   Soft tissue at the current time does not appear to be threatened by pattern of injury does warrant expeditious treatment  He will be nonweightbearing for 6-8 weeks postoperatively, will likely  restrict resisted  ankle flexion for 6 weeks or so as well   Patient at risk for perioperative complications given his substance abuse history as well as his active nicotine use  - Pain management:  May prove to be challenging  Will titrate accordingly  The think that he will be more comfortable after fixation of his fracture   Anticipate using scheduled Tylenol, scheduled Robaxin, short course of ketorolac while in the hospital and judicious use of opioid medications  - ABL anemia/Hemodynamics  stalbe  - Medical issues   Polysubstance abuse  Nicotine dependence  - DVT/PE prophylaxis:  lovenox while in hospital  ASA at dc   - ID:   periop abx   - Metabolic Bone Disease:  Anticipate less than optimal bone quality given polysubstance use   - Activity:  NWB R leg  - FEN/GI prophylaxis/Foley/Lines:  NPO   IVF  No foley   - Impediments to fracture healing:  Polysubstance abuse  Concerned about pts ability to maintain compliance   - Dispo:  OR today for ORIF R calcaneus    Jari Pigg, PA-C [facility] [phone] (C) [phone] (O) 05/20/2017, 9:25 AM

## 2017-05-21 ENCOUNTER — Encounter (HOSPITAL_COMMUNITY): Payer: Self-pay | Admitting: Orthopedic Surgery

## 2017-05-21 DIAGNOSIS — F1290 Cannabis use, unspecified, uncomplicated: Secondary | ICD-10-CM

## 2017-05-21 DIAGNOSIS — F1910 Other psychoactive substance abuse, uncomplicated: Secondary | ICD-10-CM | POA: Diagnosis present

## 2017-05-21 DIAGNOSIS — F17200 Nicotine dependence, unspecified, uncomplicated: Secondary | ICD-10-CM

## 2017-05-21 DIAGNOSIS — J45909 Unspecified asthma, uncomplicated: Secondary | ICD-10-CM

## 2017-05-21 HISTORY — DX: Other psychoactive substance abuse, uncomplicated: F19.10

## 2017-05-21 HISTORY — DX: Cannabis use, unspecified, uncomplicated: F12.90

## 2017-05-21 HISTORY — DX: Unspecified asthma, uncomplicated: J45.909

## 2017-05-21 HISTORY — DX: Nicotine dependence, unspecified, uncomplicated: F17.200

## 2017-05-21 LAB — HEPATITIS PANEL, ACUTE
HCV Ab: 0.2 s/co ratio (ref 0.0–0.9)
HEP B C IGM: NEGATIVE
Hep A IgM: NEGATIVE
Hepatitis B Surface Ag: NEGATIVE

## 2017-05-21 LAB — HIV ANTIBODY (ROUTINE TESTING W REFLEX): HIV SCREEN 4TH GENERATION: NONREACTIVE

## 2017-05-21 MED ORDER — DOCUSATE SODIUM 100 MG PO CAPS: 100.0000 mg | ORAL_CAPSULE | Freq: Two times a day (BID) | ORAL | 0 refills | Status: DC

## 2017-05-21 MED ORDER — CELECOXIB 200 MG PO CAPS: 200.0000 mg | ORAL_CAPSULE | Freq: Two times a day (BID) | ORAL | 0 refills | Status: AC

## 2017-05-21 MED ORDER — OXYCODONE-ACETAMINOPHEN 5-325 MG PO TABS: 1.0000 | ORAL_TABLET | Freq: Four times a day (QID) | ORAL | 0 refills | Status: AC | PRN

## 2017-05-21 MED ORDER — ACETAMINOPHEN 500 MG PO TABS: 500.0000 mg | ORAL_TABLET | Freq: Four times a day (QID) | ORAL | 0 refills | Status: DC | PRN

## 2017-05-21 MED ORDER — ASPIRIN EC 325 MG PO TBEC: 325.0000 mg | DELAYED_RELEASE_TABLET | Freq: Every day | ORAL | 0 refills | Status: AC

## 2017-05-21 MED ORDER — METHOCARBAMOL 750 MG PO TABS: 750.0000 mg | ORAL_TABLET | Freq: Three times a day (TID) | ORAL | 1 refills | Status: DC | PRN

## 2017-05-21 NOTE — Discharge Summary (Signed)
Orthopaedic Trauma Service (OTS)  Patient ID: Mario Jordan MRN: [id number] DOB/AGE: [birth year] 31 y.o.  Admit date: 05/19/2017 Discharge date: 05/21/2017  Admission Diagnoses: Fall/jump out of a second story window Closed right calcaneus fracture Polysubstance abuse Nicotine dependence Marijuana use Asthma  Discharge Diagnoses:  Principal Problem:   Calcaneus fracture, right, tongue type fracture Active Problems:   Polysubstance abuse (HCC)   Marijuana use   Nicotine dependence   Asthma   Past Medical History:  Diagnosis Date  . Asthma 05/21/2017  . Marijuana use 05/21/2017  . Nicotine dependence 05/21/2017  . Polysubstance abuse (HCC) 05/21/2017     Procedures Performed: 05/20/2017-Dr. Carola Frost 1. open reduction and internal fixation of right calcaneus fracture.   Discharged Condition: good  Hospital Course:   31 year old black male who supposedly jumped out of second story window on 05/19/2017 after his friends told him that the police are at the motel they were staying at.  Turned out that they were playing a joke on him.  Patient jumped out of the window and landed on both feet.  Patient had immediate onset of pain in his right foot.  He was unable to ambulate from the scene as such police and EMS were called.  Patient was brought to [facility] for evaluation patient was found to have a severely displaced tongue type fracture to his right calcaneus.  Patient was seen and evaluated by Dr. Linna Caprice but due to the complexity of the injury the orthopedic trauma service was consulted for definitive management.  Patient was initially seen by the orthopedic trauma service in the emergency department.  We did remove his splint to evaluate a soft tissue soft tissue did look okay however the bone fragments were noted to be just below the surface of the subcutaneous tissue and could potentially threaten this skin at any moment.  Patient was taken to the OR on  05/20/2017.  Procedure noted above was performed.  After surgery patient was transferred to the PACU for recovery from anesthesia and then transferred back up to the orthopedic floor for observation, pain control and therapies.  Postoperative day #1 patient was doing remarkably well his pain was well controlled.  He mobilized very well with physical therapy utilizing crutches.  As such he was deemed to be stable for discharge on postoperative day #1.  Patient was discharged in stable condition on 05/21/2017.   Please see consult note as well as nursing notes on 05/21/2017 for additional information  Consults: None  Significant Diagnostic Studies: labs:   Results for Mario Jordan, Mario Jordan (MRN [id number]) as of 05/29/2017 09:58  Ref. Range 05/20/2017 18:37  WBC Latest Ref Range: 4.0 - 10.5 K/uL 10.5  RBC Latest Ref Range: 4.22 - 5.81 MIL/uL 3.77 (L)  Hemoglobin Latest Ref Range: 13.0 - 17.0 g/dL 78.2 (L)  HCT Latest Ref Range: 39.0 - 52.0 % 36.9 (L)  MCV Latest Ref Range: 78.0 - 100.0 fL 97.9  MCH Latest Ref Range: 26.0 - 34.0 pg 33.2  MCHC Latest Ref Range: 30.0 - 36.0 g/dL 95.6  RDW Latest Ref Range: 11.5 - 15.5 % 12.5  Platelets Latest Ref Range: 150 - 400 K/uL 200  Results for Mario Jordan, Mario Jordan (MRN [id number]) as of 05/29/2017 09:58  Ref. Range 05/20/2017 07:46 05/20/2017 09:50  Hep A Ab, IgM Latest Ref Range: Negative   Negative  Hepatitis B Surface Ag Latest Ref Range: Negative   Negative  Hep B Core Ab, IgM Latest Ref Range: Negative   Negative  HCV Ab Latest Ref Range: 0.0 - 0.9 s/co ratio  0.2  HIV Screen 4th Generation wRfx Latest Ref Range: Non Reactive  Non Reactive    Results for Mario Jordan, Mario Jordan (MRN [id number]) as of 05/29/2017 09:58  Ref. Range 05/19/2017 22:13 05/19/2017 22:16  Alcohol, Ethyl (B) Latest Ref Range: <10 mg/dL 80 (H)   Amphetamines Latest Ref Range: NONE DETECTED   NONE DETECTED  Barbiturates Latest Ref Range: NONE DETECTED   NONE DETECTED   Benzodiazepines Latest Ref Range: NONE DETECTED   NONE DETECTED  Opiates Latest Ref Range: NONE DETECTED   NONE DETECTED  COCAINE Latest Ref Range: NONE DETECTED   POSITIVE (A)  Tetrahydrocannabinol Latest Ref Range: NONE DETECTED   POSITIVE (A)    Treatments: IV hydration, antibiotics: Ancef, analgesia: Dilaudid, Tylenol, Celebrex, OxyIR, anticoagulation: LMW heparin, therapies: PT, OT and RN and surgery: As above  Discharge Exam:     Orthopaedic Trauma Service (OTS)   1 Day Post-Op Procedure(s) (LRB): OPEN REDUCTION INTERNAL FIXATION (ORIF) CALCANEOUS FRACTURE (Right)   Subjective: Patient reports pain as mild.     Objective: Current Vitals Blood pressure 124/71, pulse 77, temperature 98.6 F (37 C), temperature source Oral, resp. rate 19, height 5\' 6"  (1.676 m), weight 63.5 kg (140 lb), SpO2 100 %. Vital signs in last 24 hours: Temp:  [97.7 F (36.5 C)-98.6 F (37 C)] 98.6 F (37 C) (12/19 0443) Pulse Rate:  [67-100] 77 (12/19 0443) Resp:  [13-24] 19 (12/19 0443) BP: (124-150)/(71-97) 124/71 (12/19 0443) SpO2:  [96 %-100 %] 100 % (12/19 0443)   Intake/Output from previous day: 12/18 0701 - 12/19 0700 In: 3146.3 [P.O.:360; I.V.:2786.3] Out: 1200 [Urine:1200]   LABS Recent Labs (last 2 labs)       Recent Labs    05/19/17 2213 05/20/17 1837  HGB 13.6 12.5*      Recent Labs (last 2 labs)       Recent Labs    05/19/17 2213 05/20/17 1837  WBC 12.1* 10.5  RBC 4.08* 3.77*  HCT 39.0 36.9*  PLT 223 200      Recent Labs (last 2 labs)       Recent Labs    05/19/17 2213 05/20/17 1837  NA 137  --   K 3.7  --   CL 101  --   CO2 25  --   BUN 7  --   CREATININE 0.94 0.89  GLUCOSE 61*  --   CALCIUM 9.0  --       Recent Labs (last 2 labs)   No results for input(s): LABPT, INR in the last 72 hours.       Physical Exam RLE      Dressing intact, clean, dry             Edema/ swelling controlled             Sens: DPN, SPN, TN, sural intact              Motor: EHL, FHL, and lessor toe ext and flex all intact grossly             Brisk cap refill, warm to touch   Assessment/Plan: 1 Day Post-Op Procedure(s) (LRB): OPEN REDUCTION INTERNAL FIXATION (ORIF) CALCANEUS FRACTURE (Right) 1. PT/OT NWB on RLE 2. DVT proph Lovenox with ECASA post discharge 3. F/u 2 wks, maintain splint until then   Myrene GalasMichael Handy, MD [facility], [facility] [phone] (p)     Disposition: 01-Home or Self  Care  Discharge Instructions    Call MD / Call 911   Complete by:  As directed    If you experience chest pain or shortness of breath, CALL 911 and be transported to the hospital emergency room.  If you develope a fever above 101 F, pus (white drainage) or increased drainage or redness at the wound, or calf pain, call your surgeon's office.   Constipation Prevention   Complete by:  As directed    Drink plenty of fluids.  Prune juice may be helpful.  You may use a stool softener, such as Colace (over the counter) 100 mg twice a day.  Use MiraLax (over the counter) for constipation as needed.   Diet general   Complete by:  As directed    Discharge instructions   Complete by:  As directed    Orthopaedic Trauma Service Discharge Instructions   General Discharge Instructions  WEIGHT BEARING STATUS: Nonweightbearing Right Leg   RANGE OF MOTION/ACTIVITY: ok to move toes on right foot and Right knee. Do not remove splint on right leg. Continue to elevate and ice leg as much as possible to help with swelling.   Wound Care: do not remove splint on right leg. Keep clean and dry  DVT/PE prophylaxis: aspirin 325 mg daily x 4 weeks   Diet: as you were eating previously.  Can use over the counter stool softeners and bowel preparations, such as Miralax, to help with bowel movements.  Narcotics can be constipating.  Be sure to drink plenty of fluids  PAIN MEDICATION USE AND EXPECTATIONS  You have likely been given narcotic medications to help  control your pain.  After a traumatic event that results in an fracture (broken bone) with or without surgery, it is ok to use narcotic pain medications to help control one's pain.  We understand that everyone responds to pain differently and each individual patient will be evaluated on a regular basis for the continued need for narcotic medications. Ideally, narcotic medication use should last no more than 6-8 weeks (coinciding with fracture healing).   As a patient it is your responsibility as well to monitor narcotic medication use and report the amount and frequency you use these medications when you come to your office visit.   We would also advise that if you are using narcotic medications, you should take a dose prior to therapy to maximize you participation.  IF YOU ARE ON NARCOTIC MEDICATIONS IT IS NOT PERMISSIBLE TO OPERATE A MOTOR VEHICLE (MOTORCYCLE/CAR/TRUCK/MOPED) OR HEAVY MACHINERY DO NOT MIX NARCOTICS WITH OTHER CNS (CENTRAL NERVOUS SYSTEM) DEPRESSANTS SUCH AS ALCOHOL   STOP SMOKING OR USING NICOTINE PRODUCTS!!!!  As discussed nicotine severely impairs your body's ability to heal surgical and traumatic wounds but also impairs bone healing.  Wounds and bone heal by forming microscopic blood vessels (angiogenesis) and nicotine is a vasoconstrictor (essentially, shrinks blood vessels).  Therefore, if vasoconstriction occurs to these microscopic blood vessels they essentially disappear and are unable to deliver necessary nutrients to the healing tissue.  This is one modifiable factor that you can do to dramatically increase your chances of healing your injury.    (This means no smoking, no nicotine gum, patches, etc)  DO NOT USE NONSTEROIDAL ANTI-INFLAMMATORY DRUGS (NSAID'S)  Using products such as Advil (ibuprofen), Aleve (naproxen), Motrin (ibuprofen) for additional pain control during fracture healing can delay and/or prevent the healing response.  If you would like to take over the  counter (OTC) medication, Tylenol (acetaminophen) is ok.  However, some narcotic medications that are given for pain control contain acetaminophen as well. Therefore, you should not exceed more than 4000 mg of tylenol in a day if you do not have liver disease.  Also note that there are may OTC medicines, such as cold medicines and allergy medicines that my contain tylenol as well.  If you have any questions about medications and/or interactions please ask your doctor/PA or your pharmacist.      ICE AND ELEVATE INJURED/OPERATIVE EXTREMITY  Using ice and elevating the injured extremity above your heart can help with swelling and pain control.  Icing in a pulsatile fashion, such as 20 minutes on and 20 minutes off, can be followed.    Do not place ice directly on skin. Make sure there is a barrier between to skin and the ice pack.    Using frozen items such as frozen peas works well as the conform nicely to the are that needs to be iced.  USE AN ACE WRAP OR TED HOSE FOR SWELLING CONTROL  In addition to icing and elevation, Ace wraps or TED hose are used to help limit and resolve swelling.  It is recommended to use Ace wraps or TED hose until you are informed to stop.    When using Ace Wraps start the wrapping distally (farthest away from the body) and wrap proximally (closer to the body)   Example: If you had surgery on your leg or thing and you do not have a splint on, start the ace wrap at the toes and work your way up to the thigh        If you had surgery on your upper extremity and do not have a splint on, start the ace wrap at your fingers and work your way up to the upper arm  IF YOU ARE IN A SPLINT OR CAST DO NOT REMOVE IT FOR ANY REASON   If your splint gets wet for any reason please contact the office immediately. You may shower in your splint or cast as long as you keep it dry.  This can be done by wrapping in a cast cover or garbage back (or similar)  Do Not stick any thing down your splint  or cast such as pencils, money, or hangers to try and scratch yourself with.  If you feel itchy take benadryl as prescribed on the bottle for itching  IF YOU ARE IN A CAM BOOT (BLACK BOOT)  You may remove boot periodically. Perform daily dressing changes as noted below.  Wash the liner of the boot regularly and wear a sock when wearing the boot. It is recommended that you sleep in the boot until told otherwise  CALL THE OFFICE WITH ANY QUESTIONS OR CONCERNS: [phone]   Driving restrictions   Complete by:  As directed    No driving   Increase activity slowly as tolerated   Complete by:  As directed    Non weight bearing   Complete by:  As directed    Laterality:  right   Extremity:  Lower     Allergies as of 05/21/2017   No Known Allergies     Medication List    TAKE these medications   acetaminophen 500 MG tablet Commonly known as:  TYLENOL Take 1-2 tablets (500-1,000 mg total) by mouth every 6 (six) hours as needed for mild pain.   aspirin EC 325 MG tablet Take 1 tablet (325 mg total) by mouth daily.   celecoxib 200 MG  capsule Commonly known as:  CELEBREX Take 1 capsule (200 mg total) by mouth every 12 (twelve) hours for 10 days.   docusate sodium 100 MG capsule Commonly known as:  COLACE Take 1 capsule (100 mg total) by mouth 2 (two) times daily.   methocarbamol 750 MG tablet Commonly known as:  ROBAXIN Take 1 tablet (750 mg total) by mouth every 8 (eight) hours as needed for muscle spasms.   oxyCODONE-acetaminophen 5-325 MG tablet Commonly known as:  ROXICET Take 1-2 tablets by mouth every 6 (six) hours as needed.            Discharge Care Instructions  (From admission, onward)        Start     Ordered   05/21/17 0000  Non weight bearing    Question Answer Comment  Laterality right   Extremity Lower      05/21/17 0905     Follow-up Information    Myrene Galas, MD. Schedule an appointment as soon as possible for a visit in 3 week(s).    Specialty:  Orthopedic Surgery Contact information: [address] [place] [address] [phone]           Discharge Instructions and Plan:  31 year old male status post ORIF tongue-tied right calcaneus fracture  Nonweightbearing right leg for 8 weeks Patient will remain splinted until follow-up in about 2 weeks. We will likely transition him to a Cam boot at that time where he may begin gentle range of motion. He will be restricted from active ankle extension against resistance for approximately 8 weeks as well  I am quite concerned about complications with this particular patient given his social history as well as ongoing criminal issues pertaining to outstanding warrants  His drug use and smoking puts him at increased risk for development of infection as well as nonunion.  This was explained at great length with the patient.  Patient was very appreciative of all the care that he is received and is actually quite pleasant individual despite some of the serious trouble that he is in.  Signed:  Mearl Latin, PA-C [facility] [phone] (P) 05/29/2017, 9:57 AM

## 2017-05-21 NOTE — Progress Notes (Signed)
Reviewed discharge papers with patient with full understanding and medications

## 2017-05-21 NOTE — Progress Notes (Signed)
[employer] [place] [employer] at [phone] amd notified them of Mario HavensLashwan  Demaro  Jordan B/M: DOB  05-02-1986 is [facility] per Dr Carola FrostHandy today, for his outstanding order for a violation of Felony probation and warrant for his arrest for Failure to Report New address-Sexual offender and he needs to be served with the warrants upon his release from the hospital.

## 2017-05-21 NOTE — Progress Notes (Signed)
Orthopedic Tech Progress Note Patient Details:  Mario LairLashawn D Jordan 08/10/1985 [id number]  Ortho Devices Type of Ortho Device: Crutches Ortho Device/Splint Interventions: Application   Post Interventions Patient Tolerated: Well Instructions Provided: Care of device   Nikki DomCrawford, [name] 05/21/2017, 3:24 PM

## 2017-05-21 NOTE — Evaluation (Signed)
Physical Therapy Evaluation/Discharge Patient Details Name: Mario Jordan MRN: [id number] DOB: [birth year] Today's Date: 05/21/2017   History of Present Illness  31 y.o. male admitted on 05/19/17 due to jump from height and he sustained a R calcaneal fx and is now s/p ORIF and NWB R post op.  Pt with significant PMH of polysubstance abuse, and  asthma.  Clinical Impression  Pt is quick to move, but tends to catch himself with any LOB on crutches.  He was able to demonstrate safe gait and stair training on crutches.  He is safe enough to d/c from the hospital.  He has no immediate PT f/u needs at this time. RN getting order and calling ortho tech to get him his own set of crutches.  PT to sign off.   Follow Up Recommendations No PT follow up;Supervision for mobility/OOB    Equipment Recommendations  Crutches(5'2-5'10 crutches)    Recommendations for Other Services   NA    Precautions / Restrictions Precautions Precautions: Fall Precaution Comments: pt is a bit impulsive, fast to move.  Restrictions RLE Weight Bearing: Non weight bearing      Mobility  Bed Mobility Overal bed mobility: Modified Independent             General bed mobility comments: HOB up  Transfers Overall transfer level: Needs assistance Equipment used: Crutches Transfers: Sit to/from Stand Sit to Stand: Supervision         General transfer comment: supervision for safety cues for slow movements and to hold to soild (non rolling) surfaces like bed rail   Ambulation/Gait Ambulation/Gait assistance: Min guard Ambulation Distance (Feet): 85 Feet Assistive device: Crutches Gait Pattern/deviations: Step-to pattern;Step-through pattern(hop to and hop through gait pattern) Gait velocity: fast, needs cues to slow gait speed and for hop to gait pattern for now.       Stairs Stairs: Yes Stairs assistance: Supervision Stair Management: One rail Right;One rail Left;Step to  pattern;Forwards;With crutches;Two rails Number of Stairs: 2(x2 once with 1 rail, 1 crutch, once with both rails. ) General stair comments: Pt was able to demonstrate safe stair management simulating the two different sets of stairs he has to enter his home.  Cues were needed for LE sequencing and crutch seqencing.        Balance Overall balance assessment: Needs assistance Sitting-balance support: Feet supported;No upper extremity supported Sitting balance-Leahy Scale: Good     Standing balance support: Single extremity supported Standing balance-Leahy Scale: Fair Standing balance comment: supervision in standing with one hand supported on something for balance.                              Pertinent Vitals/Pain Pain Assessment: Faces Faces Pain Scale: Hurts little more Pain Location: right heel Pain Descriptors / Indicators: Throbbing Pain Intervention(s): Limited activity within patient's tolerance;Monitored during session;Repositioned    Home Living Family/patient expects to be discharged to:: Private residence Living Arrangements: Spouse/significant other(fiance) Available Help at Discharge: Available 24 hours/day Type of Home: Apartment Home Access: Stairs to enter Entrance Stairs-Rails: Doctor, general practiceight;Left Entrance Stairs-Number of Steps: 2 flights Home Layout: Two level Home Equipment: None      Prior Function Level of Independence: Independent               Hand Dominance   Dominant Hand: Right    Extremity/Trunk Assessment   Upper Extremity Assessment Upper Extremity Assessment: Defer to OT evaluation    Lower Extremity Assessment  Lower Extremity Assessment: RLE deficits/detail RLE Deficits / Details: right leg with normal post op pain and weakness, ankle is immobilized in hard splint.  Pt is able to both feel and wiggle his right toes.     Cervical / Trunk Assessment Cervical / Trunk Assessment: Normal  Communication   Communication: No  difficulties  Cognition Arousal/Alertness: Awake/alert Behavior During Therapy: Impulsive Overall Cognitive Status: Within Functional Limits for tasks assessed                                 General Comments: pt reports he has ADHD to OT      General Comments General comments (skin integrity, edema, etc.): Encouraged frequent R toe wiggling and elevation of right lower leg above the level of his heart.         Assessment/Plan    PT Assessment Patent does not need any further PT services         PT Goals (Current goals can be found in the Care Plan section)  Acute Rehab PT Goals Patient Stated Goal: to go home this afternoon PT Goal Formulation: All assessment and education complete, DC therapy            AM-PAC PT "6 Clicks" Daily Activity  Outcome Measure Difficulty turning over in bed (including adjusting bedclothes, sheets and blankets)?: None Difficulty moving from lying on back to sitting on the side of the bed? : None Difficulty sitting down on and standing up from a chair with arms (e.g., wheelchair, bedside commode, etc,.)?: None Help needed moving to and from a bed to chair (including a wheelchair)?: A Little Help needed walking in hospital room?: A Little Help needed climbing 3-5 steps with a railing? : A Little 6 Click Score: 21    End of Session Equipment Utilized During Treatment: Gait belt Activity Tolerance: Patient tolerated treatment well Patient left: in bed;with call bell/phone within reach Nurse Communication: Mobility status PT Visit Diagnosis: Unsteadiness on feet (R26.81);Difficulty in walking, not elsewhere classified (R26.2);Pain Pain - Right/Left: Right Pain - part of body: Ankle and joints of foot    Time: 1429-1450 PT Time Calculation (min) (ACUTE ONLY): 21 min   Charges:         Lurena Joinerebecca B. Glenora Morocho, PT, DPT [phone]   PT Evaluation $PT Eval Low Complexity: 1 Low     05/21/2017, 3:06 PM

## 2017-05-21 NOTE — Discharge Instructions (Signed)
[facility]thopaedic Trauma Service Discharge Instructions   General Discharge Instructions  WEIGHT BEARING STATUS: Nonweightbearing Right Leg   RANGE OF MOTION/ACTIVITY: ok to move toes on right foot and Right knee. Do not remove splint on right leg. Continue to elevate and ice leg as much as possible to help with swelling.   Wound Care: do not remove splint on right leg. Keep clean and dry  DVT/PE prophylaxis: aspirin 325 mg daily x 4 weeks   Diet: as you were eating previously.  Can use over the counter stool softeners and bowel preparations, such as Miralax, to help with bowel movements.  Narcotics can be constipating.  Be sure to drink plenty of fluids  PAIN MEDICATION USE AND EXPECTATIONS  You have likely been given narcotic medications to help control your pain.  After a traumatic event that results in an fracture (broken bone) with or without surgery, it is ok to use narcotic pain medications to help control one's pain.  We understand that everyone responds to pain differently and each individual patient will be evaluated on a regular basis for the continued need for narcotic medications. Ideally, narcotic medication use should last no more than 6-8 weeks (coinciding with fracture healing).   As a patient it is your responsibility as well to monitor narcotic medication use and report the amount and frequency you use these medications when you come to your office visit.   We would also advise that if you are using narcotic medications, you should take a dose prior to therapy to maximize you participation.  IF YOU ARE ON NARCOTIC MEDICATIONS IT IS NOT PERMISSIBLE TO OPERATE A MOTOR VEHICLE (MOTORCYCLE/CAR/TRUCK/MOPED) OR HEAVY MACHINERY DO NOT MIX NARCOTICS WITH OTHER CNS (CENTRAL NERVOUS SYSTEM) DEPRESSANTS SUCH AS ALCOHOL   STOP SMOKING OR USING NICOTINE PRODUCTS!!!!  As discussed nicotine severely impairs your body's ability to heal surgical and traumatic wounds but also impairs bone  healing.  Wounds and bone heal by forming microscopic blood vessels (angiogenesis) and nicotine is a vasoconstrictor (essentially, shrinks blood vessels).  Therefore, if vasoconstriction occurs to these microscopic blood vessels they essentially disappear and are unable to deliver necessary nutrients to the healing tissue.  This is one modifiable factor that you can do to dramatically increase your chances of healing your injury.    (This means no smoking, no nicotine gum, patches, etc)  DO NOT USE NONSTEROIDAL ANTI-INFLAMMATORY DRUGS (NSAID'S)  Using products such as Advil (ibuprofen), Aleve (naproxen), Motrin (ibuprofen) for additional pain control during fracture healing can delay and/or prevent the healing response.  If you would like to take over the counter (OTC) medication, Tylenol (acetaminophen) is ok.  However, some narcotic medications that are given for pain control contain acetaminophen as well. Therefore, you should not exceed more than 4000 mg of tylenol in a day if you do not have liver disease.  Also note that there are may OTC medicines, such as cold medicines and allergy medicines that my contain tylenol as well.  If you have any questions about medications and/or interactions please ask your doctor/PA or your pharmacist.      ICE AND ELEVATE INJURED/OPERATIVE EXTREMITY  Using ice and elevating the injured extremity above your heart can help with swelling and pain control.  Icing in a pulsatile fashion, such as 20 minutes on and 20 minutes off, can be followed.    Do not place ice directly on skin. Make sure there is a barrier between to skin and the ice pack.  Using frozen items such as frozen peas works well as the conform nicely to the are that needs to be iced.  USE AN ACE WRAP OR TED HOSE FOR SWELLING CONTROL  In addition to icing and elevation, Ace wraps or TED hose are used to help limit and resolve swelling.  It is recommended to use Ace wraps or TED hose until you are  informed to stop.    When using Ace Wraps start the wrapping distally (farthest away from the body) and wrap proximally (closer to the body)   Example: If you had surgery on your leg or thing and you do not have a splint on, start the ace wrap at the toes and work your way up to the thigh        If you had surgery on your upper extremity and do not have a splint on, start the ace wrap at your fingers and work your way up to the upper arm  IF YOU ARE IN A SPLINT OR CAST DO NOT REMOVE IT FOR ANY REASON   If your splint gets wet for any reason please contact the office immediately. You may shower in your splint or cast as long as you keep it dry.  This can be done by wrapping in a cast cover or garbage back (or similar)  Do Not stick any thing down your splint or cast such as pencils, money, or hangers to try and scratch yourself with.  If you feel itchy take benadryl as prescribed on the bottle for itching  IF YOU ARE IN A CAM BOOT (BLACK BOOT)  You may remove boot periodically. Perform daily dressing changes as noted below.  Wash the liner of the boot regularly and wear a sock when wearing the boot. It is recommended that you sleep in the boot until told otherwise  CALL THE OFFICE WITH ANY QUESTIONS OR CONCERNS: [phone]

## 2017-05-21 NOTE — Progress Notes (Signed)
Orthopaedic Trauma Service (OTS)  1 Day Post-Op Procedure(s) (LRB): OPEN REDUCTION INTERNAL FIXATION (ORIF) CALCANEOUS FRACTURE (Right)  Subjective: Patient reports pain as mild.    Objective: Current Vitals Blood pressure 124/71, pulse 77, temperature 98.6 F (37 C), temperature source Oral, resp. rate 19, height 5\' 6"  (1.676 m), weight 63.5 kg (140 lb), SpO2 100 %. Vital signs in last 24 hours: Temp:  [97.7 F (36.5 C)-98.6 F (37 C)] 98.6 F (37 C) (12/19 0443) Pulse Rate:  [67-100] 77 (12/19 0443) Resp:  [13-24] 19 (12/19 0443) BP: (124-150)/(71-97) 124/71 (12/19 0443) SpO2:  [96 %-100 %] 100 % (12/19 0443)  Intake/Output from previous day: 12/18 0701 - 12/19 0700 In: 3146.3 [P.O.:360; I.V.:2786.3] Out: 1200 [Urine:1200]  LABS Recent Labs    05/19/17 2213 05/20/17 1837  HGB 13.6 12.5*   Recent Labs    05/19/17 2213 05/20/17 1837  WBC 12.1* 10.5  RBC 4.08* 3.77*  HCT 39.0 36.9*  PLT 223 200   Recent Labs    05/19/17 2213 05/20/17 1837  NA 137  --   K 3.7  --   CL 101  --   CO2 25  --   BUN 7  --   CREATININE 0.94 0.89  GLUCOSE 61*  --   CALCIUM 9.0  --    No results for input(s): LABPT, INR in the last 72 hours.   Physical Exam RLE  Dressing intact, clean, dry  Edema/ swelling controlled  Sens: DPN, SPN, TN, sural intact  Motor: EHL, FHL, and lessor toe ext and flex all intact grossly  Brisk cap refill, warm to touch  Assessment/Plan: 1 Day Post-Op Procedure(s) (LRB): OPEN REDUCTION INTERNAL FIXATION (ORIF) CALCANEUS FRACTURE (Right) 1. PT/OT NWB on RLE 2. DVT proph Lovenox with ECASA post discharge 3. F/u 2 wks, maintain splint until then  Myrene GalasMichael Nevae Pinnix, MD [facility] [phone] (p)

## 2017-05-21 NOTE — Evaluation (Signed)
Occupational Therapy Evaluation and Discharge Patient Details Name: Mario LairLashawn D XXXWhitehead MRN: [id number] DOB: January 23, 1986 Today's Date: 05/21/2017    History of Present Illness Pt jumped from the second floor of a hotel and sustained a R calcaneous fx, ORIF 12/18.   Clinical Impression   Pt performed ADL and ADL transfers with supervision for safety due to impulsivity. Pt used RW this visit and was able to maintain NWB with no difficulty. Educated pt in importance of elevating R LE at rest. Per RN, [facility]. No further OT needs    Follow Up Recommendations  No OT follow up    Equipment Recommendations  None recommended by OT    Recommendations for Other Services       Precautions / Restrictions Precautions Precautions: Fall Restrictions Weight Bearing Restrictions: Yes RLE Weight Bearing: Non weight bearing      Mobility Bed Mobility Overal bed mobility: Modified Independent             General bed mobility comments: HOB up  Transfers Overall transfer level: Needs assistance Equipment used: Rolling walker (2 wheeled) Transfers: Sit to/from Stand Sit to Stand: Supervision         General transfer comment: for safety due to impulsivity    Balance                                           ADL either performed or assessed with clinical judgement   ADL                                         General ADL Comments: Pt seated at sink, performed ADL. Educated in tub transfer, he has access to his grandmother's tub transfer bench.     Vision Baseline Vision/History: No visual deficits       Perception     Praxis      Pertinent Vitals/Pain Pain Assessment: Faces Faces Pain Scale: Hurts little more Pain Location: R LE Pain Descriptors / Indicators: Throbbing Pain Intervention(s): Monitored during session;Repositioned;Ice applied     Hand Dominance Right   Extremity/Trunk Assessment  Upper Extremity Assessment Upper Extremity Assessment: Overall WFL for tasks assessed   Lower Extremity Assessment Lower Extremity Assessment: Defer to PT evaluation   Cervical / Trunk Assessment Cervical / Trunk Assessment: Normal   Communication Communication Communication: No difficulties   Cognition Arousal/Alertness: Awake/alert Behavior During Therapy: Impulsive Overall Cognitive Status: Within Functional Limits for tasks assessed                                 General Comments: pt reports he has ADHD   General Comments       Exercises     Shoulder Instructions      Home Living Family/patient expects to be discharged to:: Private residence Living Arrangements: Spouse/significant other(fiance) Available Help at Discharge: Available 24 hours/day Type of Home: Apartment Home Access: Stairs to enter Entergy CorporationEntrance Stairs-Number of Steps: 2 flights Entrance Stairs-Rails: Right;Left Home Layout: Two level Alternate Level Stairs-Number of Steps: flight   Bathroom Shower/Tub: Chief Strategy OfficerTub/shower unit   Bathroom Toilet: Standard     Home Equipment: None          Prior Functioning/Environment Level  of Independence: Independent                 OT Problem List: Impaired balance (sitting and/or standing);Pain      OT Treatment/Interventions:      OT Goals(Current goals can be found in the care plan section)    OT Frequency:     Barriers to D/C:            Co-evaluation              AM-PAC PT "6 Clicks" Daily Activity     Outcome Measure Help from another person eating meals?: None Help from another person taking care of personal grooming?: A Little Help from another person toileting, which includes using toliet, bedpan, or urinal?: A Little Help from another person bathing (including washing, rinsing, drying)?: A Little Help from another person to put on and taking off regular upper body clothing?: None Help from another person to put on  and taking off regular lower body clothing?: A Little 6 Click Score: 20   End of Session Equipment Utilized During Treatment: Rolling walker  Activity Tolerance: Patient tolerated treatment well Patient left: in bed;with family/visitor present;with call bell/phone within reach  OT Visit Diagnosis: Other abnormalities of gait and mobility (R26.89);Pain                Time: [id number] OT Time Calculation (min): 30 min Charges:  OT General Charges $OT Visit: 1 Visit OT Evaluation $OT Eval Low Complexity: 1 Low OT Treatments $Self Care/Home Management : 8-22 mins G-Codes:     Evern BioMayberry, Kalayla Shadden Lynn 05/21/2017, 10:57 AM  05/21/2017 Martie RoundJulie Nuno Brubacher, OTR/L Pager: [phone]

## 2017-05-23 ENCOUNTER — Encounter (HOSPITAL_COMMUNITY): Payer: Self-pay | Admitting: Emergency Medicine

## 2017-05-29 ENCOUNTER — Encounter (HOSPITAL_COMMUNITY): Payer: Self-pay | Admitting: Orthopedic Surgery

## 2019-08-27 IMAGING — CT CT HEAD W/O CM
4 of 8 series · 15 of 47 positions shown, 16 images · non-contrast
Comparison: None.

CLINICAL DATA: Trauma, jumped from second story window.

EXAM:
CT HEAD WITHOUT CONTRAST
CT CERVICAL SPINE WITHOUT CONTRAST
TECHNIQUE: Multidetector CT imaging of the head and cervical spine was
performed following the standard protocol without intravenous
contrast. Multiplanar CT image reconstructions of the cervical spine
were also generated.

[Series 3: head without · axial · non-contrast · 0.46mm/px · z∈[-121,+39]mm · 3 of 33 slices shown, 4 images]
[im 1/33  brain]
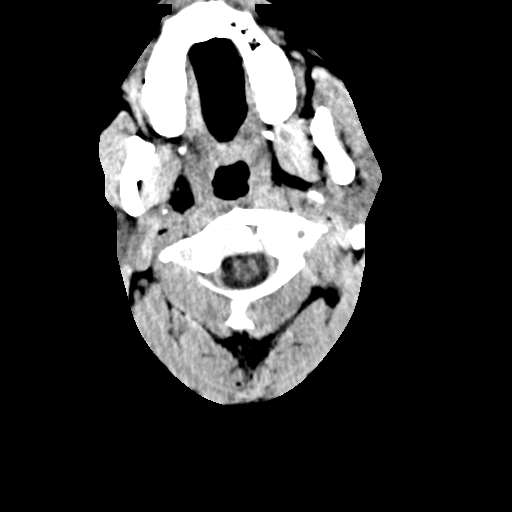
[im 1/33  bone]
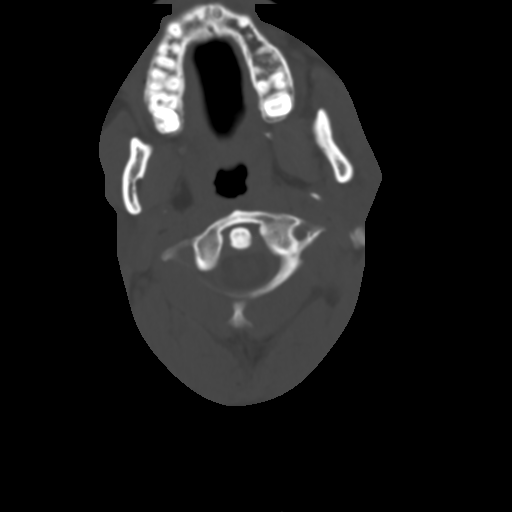
[im 17/33  brain]
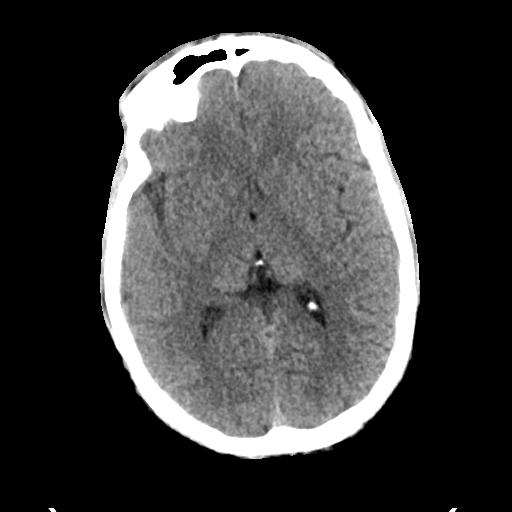
[im 33/33  brain]
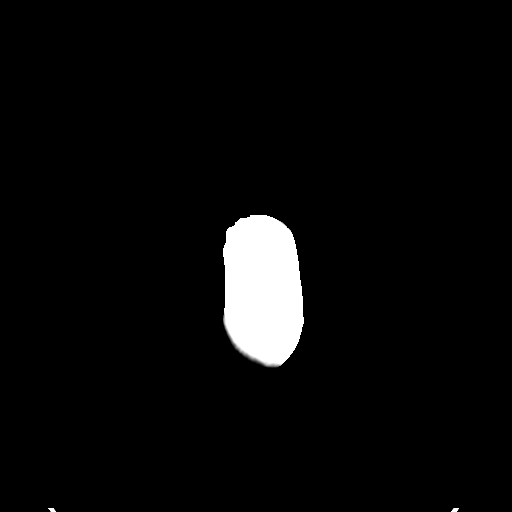

[Series 4: head bone · axial · 0.47mm/px · z∈[-100,+20]mm · 7 of 81 slices shown]
[im 11/81  bone]
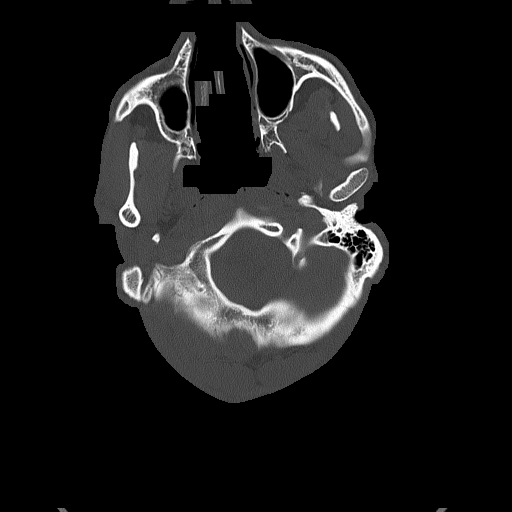
[im 21/81  bone]
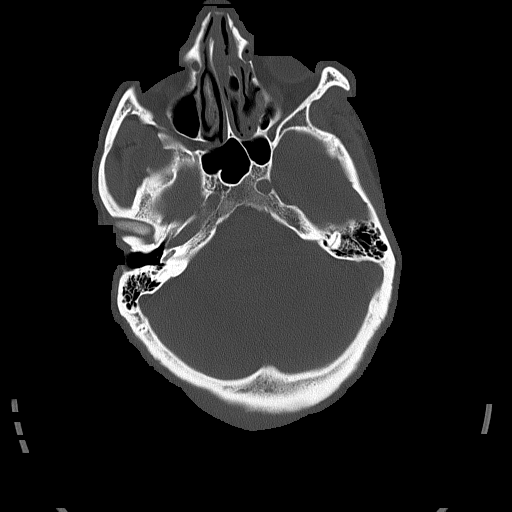
[im 31/81  bone]
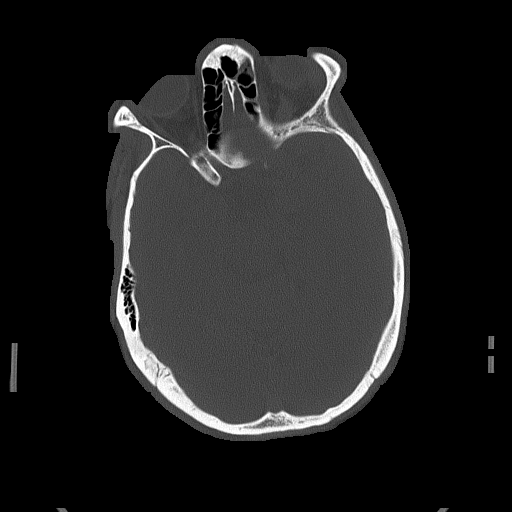
[im 41/81  bone]
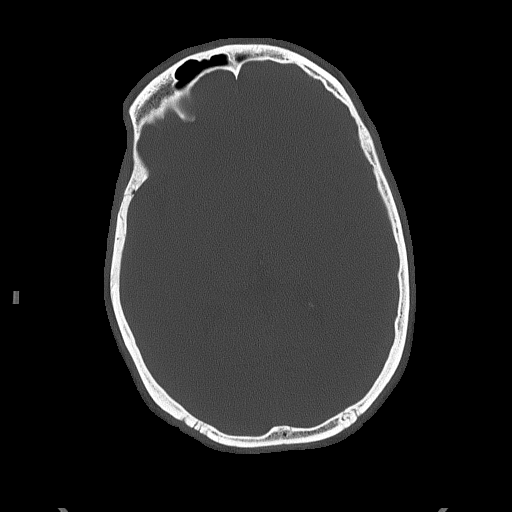
[im 51/81  bone]
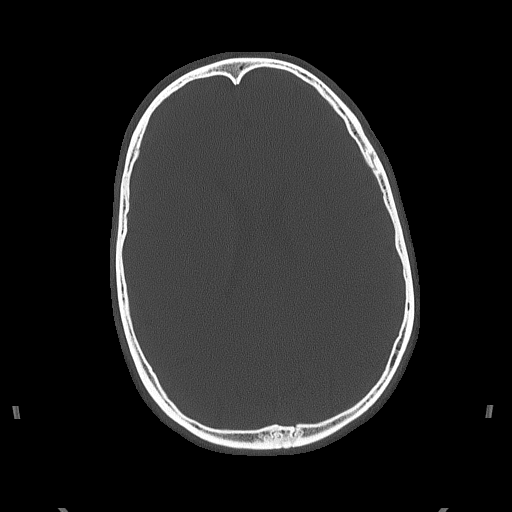
[im 61/81  bone]
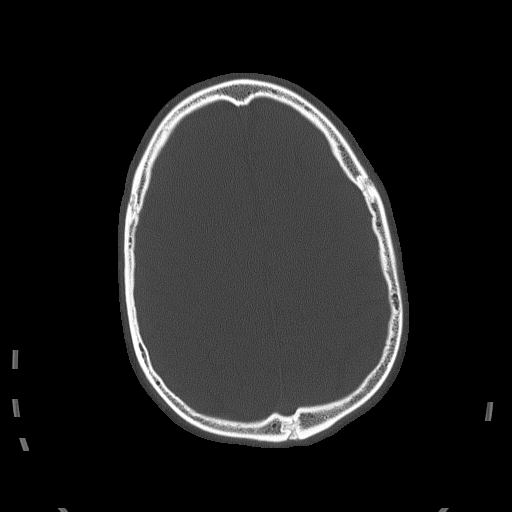
[im 71/81  bone]
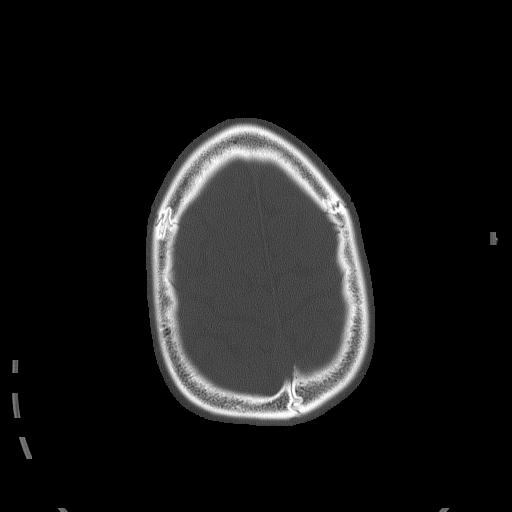

[Series 6: head without sag · sagittal · non-contrast · 0.32mm/px · 2 of 66 slices shown]
[im 22/66  brain]
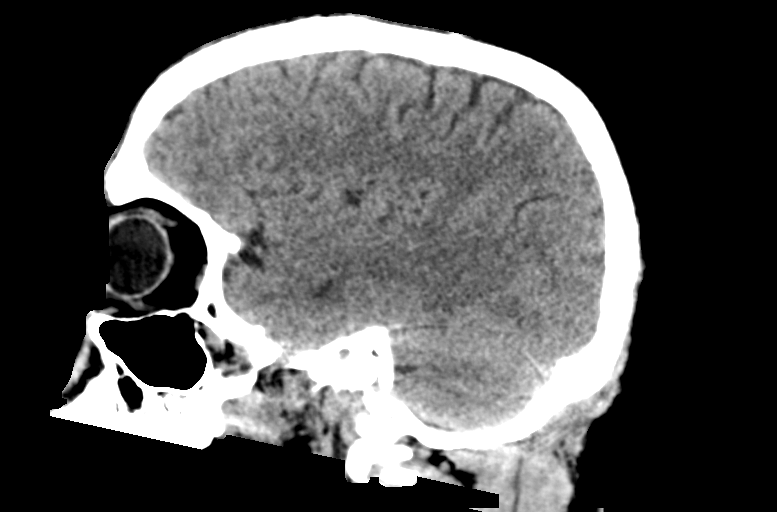
[im 44/66  brain]
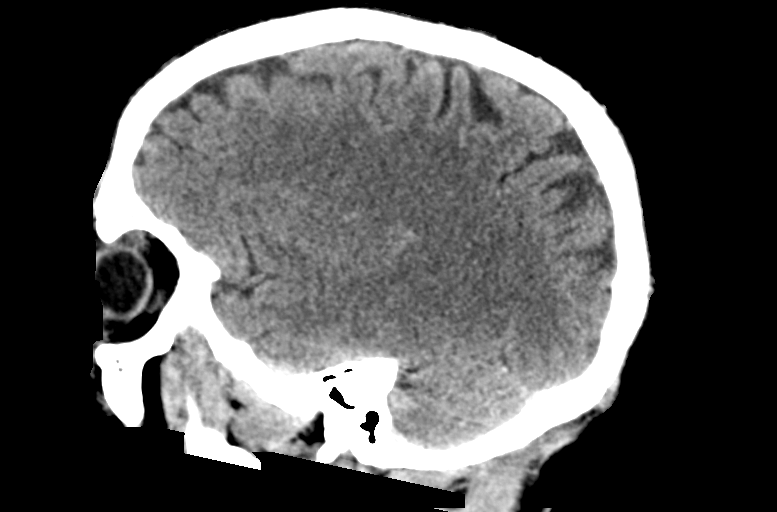

[Series 11: c_spine 2.0 cor bone · coronal · 0.24mm/px · 3 of 46 slices shown]
[im 17/46  brain]
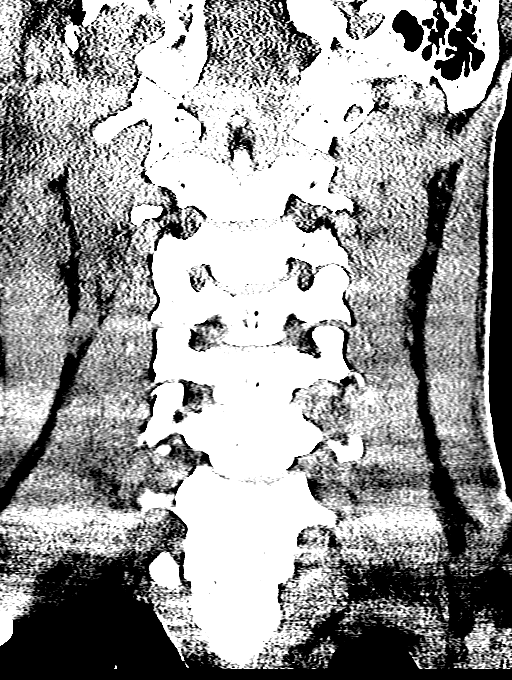
[im 23/46  brain]
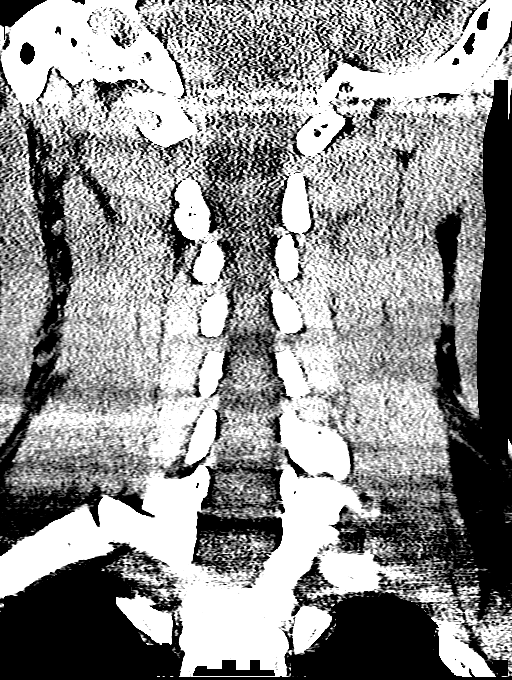
[im 29/46  brain]
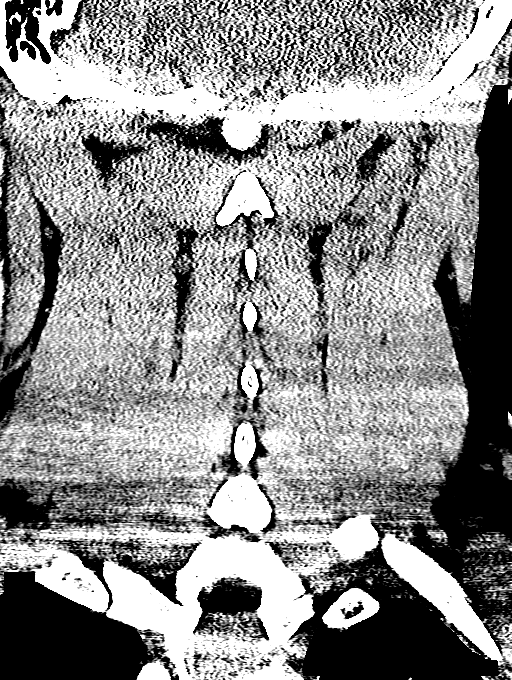

[15 of 47 positions shown; findings below may reference images not displayed]

FINDINGS: CT HEAD FINDINGS

Brain: No intracranial hemorrhage, mass effect, or midline shift. No
hydrocephalus. The basilar cisterns are patent. No evidence of
territorial infarct or acute ischemia. No extra-axial or
intracranial fluid collection.

Vascular: No hyperdense vessel or unexpected calcification.

Skull: No fracture or focal lesion.

Sinuses/Orbits: Trace mucosal thickening of ethmoid air cells and
left frontal sinus. No sinus fluid levels. Mastoid air cells are
clear. Visualized orbits are unremarkable.

Other: None.

CT CERVICAL SPINE FINDINGS

Alignment: Normal.

Skull base and vertebrae: No acute fracture. Vertebral body heights
are maintained. The dens and skull base are intact.

Soft tissues and spinal canal: No prevertebral fluid or swelling. No
visible canal hematoma.

Disc levels:  Normal.

Upper chest: Negative.

Other: None.
IMPRESSION: Normal CT of the head and cervical spine. No acute traumatic injury.

## 2019-08-28 IMAGING — RF DG C-ARM 61-120 MIN
1 series · 2 of 2 positions shown · non-contrast
Comparison: Preoperative study May 19, 2017

CLINICAL DATA: ORIF of right calcaneus fracture.

EXAM:
DG C-ARM 61-120 MIN; RIGHT OS CALCIS - 2+ VIEW

[Series 1: run · 2 of 2 slices shown]
[im 1/2]
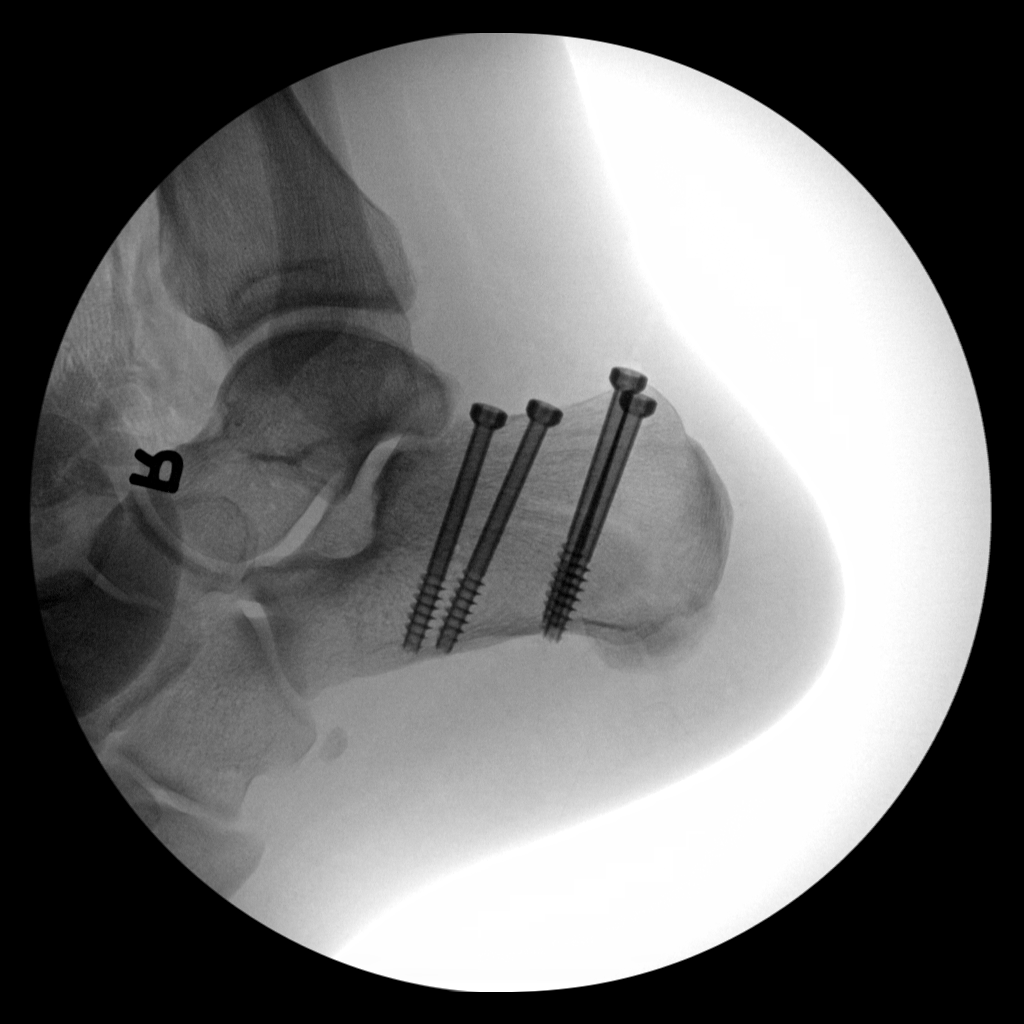
[im 2/2]
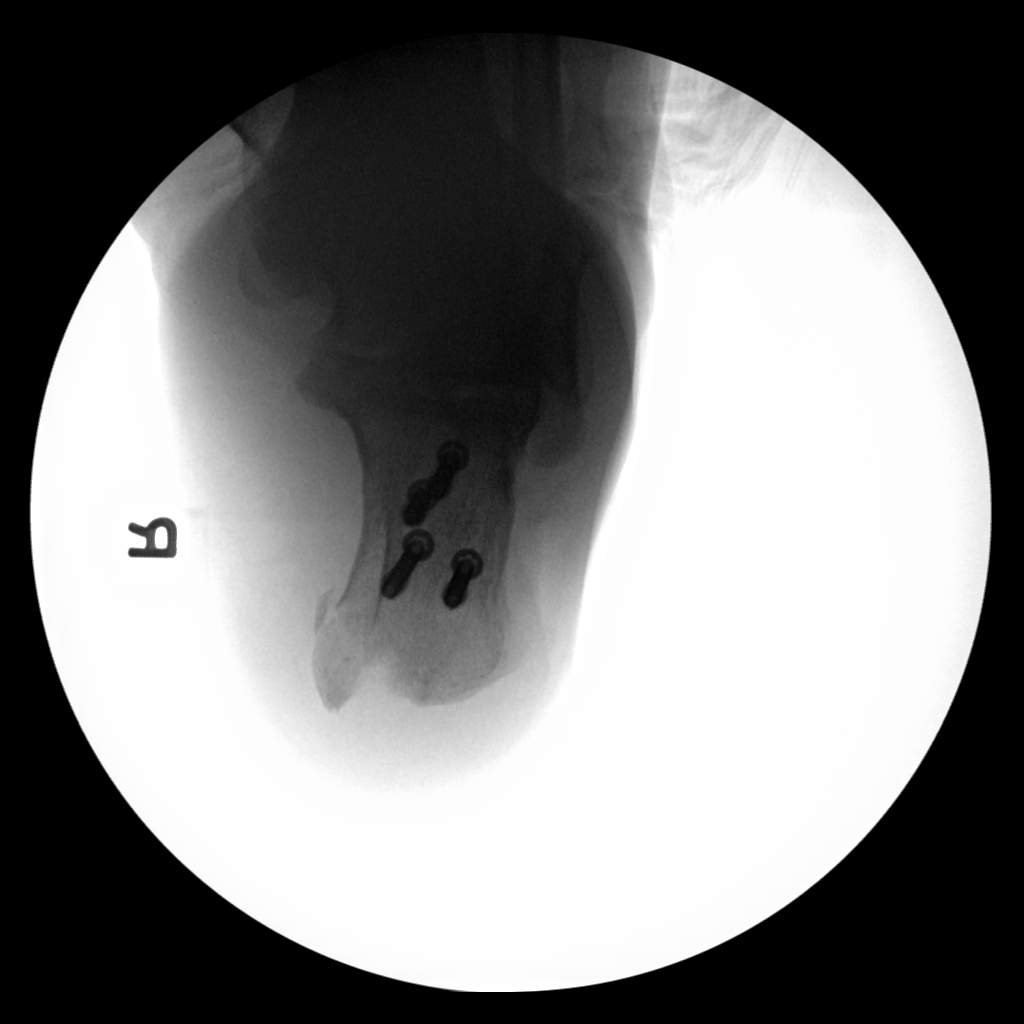

[2 of 2 positions shown; findings below may reference images not displayed]

FINDINGS: 2 fluoro spot radiographs are submitted. Fluoro time was reported is
48 seconds.

The patient has undergone ORIF with successful reduction of the
distracted fracture the posterior aspect of the calcaneus. The
fracture fragments are near anatomic. Four cortical screws are
present.
IMPRESSION: Successful ORIF of a distracted calcaneal fracture. No immediate
postprocedure complication.

## 2020-02-22 ENCOUNTER — Other Ambulatory Visit: Payer: Self-pay

## 2020-02-22 ENCOUNTER — Ambulatory Visit
Admission: EM | Admit: 2020-02-22 | Discharge: 2020-02-22 | Disposition: A | Discharge status: Home / Self Care | Payer: Self-pay

## 2020-02-22 ENCOUNTER — Encounter: Payer: Self-pay | Admitting: *Deleted

## 2020-02-22 DIAGNOSIS — R21 Rash and other nonspecific skin eruption: Secondary | ICD-10-CM

## 2020-02-22 MED ORDER — HYDROXYZINE HCL 25 MG PO TABS: 25.0000 mg | ORAL_TABLET | Freq: Four times a day (QID) | ORAL | 0 refills | Status: DC

## 2020-02-22 MED ORDER — PREDNISONE 10 MG (21) PO TBPK: ORAL_TABLET | Freq: Every day | ORAL | 0 refills | Status: DC

## 2020-02-22 NOTE — ED Triage Notes (Signed)
Patient in with generalized rash that started a month ago. Patient denies starting any new medications.Rash noted to be on back, arms, chest and legs. Patient has complaints of itching and has been taking benadryl.

## 2020-02-22 NOTE — ED Provider Notes (Signed)
[facility]    CSN: [id number] Arrival date & time: 02/22/20  0932      History   Chief Complaint Chief Complaint  Patient presents with  . Rash    HPI Mario Jordan is a 34 y.o. male  Presenting for generalized pruritic rash x1 month. Patient denies known changes to diet, lifestyle, medications, topical products. No fever, malaise, fatigue, headache, arthralgias, myalgias, chest pain, difficulty breathing, cough. No known bug bites. Has been using Benadryl with mild relief of pruritus.  Past Medical History:  Diagnosis Date  . Asthma 05/21/2017  . Marijuana use 05/21/2017  . Nicotine dependence 05/21/2017  . Polysubstance abuse (HCC) 05/21/2017    Patient Active Problem List   Diagnosis Date Noted  . Polysubstance abuse (HCC) 05/21/2017  . Marijuana use 05/21/2017  . Nicotine dependence 05/21/2017  . Asthma 05/21/2017  . Calcaneus fracture, right, tongue type fracture 05/20/2017    Past Surgical History:  Procedure Laterality Date  . ORIF CALCANEOUS FRACTURE Right 05/20/2017   Procedure: OPEN REDUCTION INTERNAL FIXATION (ORIF) CALCANEOUS FRACTURE;  Surgeon: Myrene Galas, MD;  Location: [facility] OR;  Service: Orthopedics;  Laterality: Right;       Home Medications    Prior to Admission medications   Medication Sig Start Date End Date Taking? Authorizing Provider  mirtazapine (REMERON) 30 MG tablet Take 40 mg by mouth at bedtime.   Yes [provider]  OLANZapine (ZYPREXA) 20 MG tablet Take 20 mg by mouth daily.   Yes [provider]  OLANZapine (ZYPREXA) 20 MG tablet Take 30 mg by mouth at bedtime.   Yes [provider]  acetaminophen (TYLENOL) 500 MG tablet Take 1-2 tablets (500-1,000 mg total) by mouth every 6 (six) hours as needed for mild pain. 05/21/17   Montez Morita, PA-C  Aspirin-Acetaminophen-Caffeine (EXCEDRIN EXTRA STRENGTH PO) Take 4 tablets by mouth once as needed (pain).    [provider]  docusate  sodium (COLACE) 100 MG capsule Take 1 capsule (100 mg total) by mouth 2 (two) times daily. 05/21/17   Montez Morita, PA-C  hydrOXYzine (ATARAX/VISTARIL) 25 MG tablet Take 1 tablet (25 mg total) by mouth every 6 (six) hours. 02/22/20   Hall-Potvin, [place], PA-C  predniSONE (STERAPRED UNI-PAK 21 TAB) 10 MG (21) TBPK tablet Take by mouth daily. Take steroid taper as written 02/22/20   Hall-Potvin, [place], PA-C    Family History Family History  Problem Relation Age of Onset  . Healthy Mother   . Healthy Father     Social History Social History   Tobacco Use  . Smoking status: Current Every Day Smoker    Types: Cigars  . Smokeless tobacco: Never Used  Vaping Use  . Vaping Use: Never used  Substance Use Topics  . Alcohol use: Yes    Comment: occasion  . Drug use: Yes    Types: Cocaine, Methamphetamines     Allergies   Patient has no known allergies.   Review of Systems As per HPI   Physical Exam Triage Vital Signs ED Triage Vitals  Enc Vitals Group     BP 02/22/20 1011 120/74     Pulse Rate 02/22/20 1011 90     Resp 02/22/20 1011 20     Temp 02/22/20 1011 98.2 F (36.8 C)     Temp Source 02/22/20 1011 Oral     SpO2 02/22/20 1011 96 %     Weight --      Height --  Head Circumference --      Peak Flow --      Pain Score 02/22/20 1013 0     Pain Loc --      Pain Edu? --      Excl. in GC? --    No data found.  Updated Vital Signs BP 120/74 (BP Location: Left Arm)   Pulse 90   Temp 98.2 F (36.8 C) (Oral)   Resp 20   SpO2 96%   Visual Acuity Right Eye Distance:   Left Eye Distance:   Bilateral Distance:    Right Eye Near:   Left Eye Near:    Bilateral Near:     Physical Exam Constitutional:      General: He is not in acute distress. HENT:     Head: Normocephalic and atraumatic.  Eyes:     General: No scleral icterus.    Pupils: Pupils are equal, round, and reactive to light.  Cardiovascular:     Rate and Rhythm: Normal rate.  Pulmonary:      Effort: Pulmonary effort is normal. No respiratory distress.     Breath sounds: No wheezing.  Skin:    Coloration: Skin is not jaundiced or pale.     Findings: Rash present.     Comments: Diffuse maculopapular rash without surrounding erythema, tenderness, discharge  Neurological:     Mental Status: He is alert and oriented to person, place, and time.      UC Treatments / Results  Labs (all labs ordered are listed, but only abnormal results are displayed) Labs Reviewed - No data to display  EKG   Radiology No results found.  Procedures Procedures (including critical care time)  Medications Ordered in UC Medications - No data to display  Initial Impression / Assessment and Plan / UC Course  I have reviewed the triage vital signs and the nursing notes.  Pertinent labs & imaging results that were available during my care of the patient were reviewed by me and considered in my medical decision making (see chart for details).     Patient afebrile, nontoxic, without systemic symptoms. Discussed viral VS autoimmune process VS cutaneous irritation. Will treat supportively as outlined below, follow-up with dermatology.  Return precautions discussed, pt verbalized understanding and is agreeable to plan. Final Clinical Impressions(s) / UC Diagnoses   Final diagnoses:  Rash and nonspecific skin eruption     Discharge Instructions     Keep skin clean - may use gentle soaps without perfumes/dyes. Avoid hot water (showers, baths) as this can further dry out and irritate skin. Pat skin dry as rubbing can irritate and tear skin. Apply a gentle moisturizer 1-2 times daily.    ED Prescriptions    Medication Sig Dispense Auth. Provider   predniSONE (STERAPRED UNI-PAK 21 TAB) 10 MG (21) TBPK tablet Take by mouth daily. Take steroid taper as written 21 tablet Hall-Potvin, [place], PA-C   hydrOXYzine (ATARAX/VISTARIL) 25 MG tablet Take 1 tablet (25 mg total) by mouth every 6  (six) hours. 12 tablet Hall-Potvin, [place], PA-C     PDMP not reviewed this encounter.   Hall-Potvin, [place], [place] 02/22/20 1137

## 2020-02-22 NOTE — Discharge Instructions (Addendum)
Keep skin clean - may use gentle soaps without perfumes/dyes. Avoid hot water (showers, baths) as this can further dry out and irritate skin. Pat skin dry as rubbing can irritate and tear skin. Apply a gentle moisturizer 1-2 times daily. 

## 2021-01-03 ENCOUNTER — Encounter (HOSPITAL_COMMUNITY): Payer: Self-pay

## 2021-01-03 ENCOUNTER — Other Ambulatory Visit: Payer: Self-pay

## 2021-01-03 ENCOUNTER — Emergency Department (HOSPITAL_COMMUNITY)
Admission: EM | Admit: 2021-01-03 | Discharge: 2021-01-03 | Disposition: A | Discharge status: Home / Self Care | Payer: Self-pay | Attending: Emergency Medicine | Admitting: Emergency Medicine

## 2021-01-03 DIAGNOSIS — R1010 Upper abdominal pain, unspecified: Secondary | ICD-10-CM | POA: Insufficient documentation

## 2021-01-03 DIAGNOSIS — R10816 Epigastric abdominal tenderness: Secondary | ICD-10-CM | POA: Insufficient documentation

## 2021-01-03 DIAGNOSIS — F17290 Nicotine dependence, other tobacco product, uncomplicated: Secondary | ICD-10-CM | POA: Insufficient documentation

## 2021-01-03 DIAGNOSIS — R112 Nausea with vomiting, unspecified: Secondary | ICD-10-CM | POA: Insufficient documentation

## 2021-01-03 DIAGNOSIS — J45909 Unspecified asthma, uncomplicated: Secondary | ICD-10-CM | POA: Insufficient documentation

## 2021-01-03 DIAGNOSIS — R030 Elevated blood-pressure reading, without diagnosis of hypertension: Secondary | ICD-10-CM

## 2021-01-03 LAB — URINALYSIS, ROUTINE W REFLEX MICROSCOPIC
Bilirubin Urine: NEGATIVE
Glucose, UA: NEGATIVE mg/dL
Hgb urine dipstick: NEGATIVE
Ketones, ur: NEGATIVE mg/dL
Leukocytes,Ua: NEGATIVE
Nitrite: NEGATIVE
Protein, ur: NEGATIVE mg/dL
Specific Gravity, Urine: 1.015 (ref 1.005–1.030)
pH: 6 (ref 5.0–8.0)

## 2021-01-03 LAB — COMPREHENSIVE METABOLIC PANEL
ALT: 35 U/L (ref 0–44)
AST: 54 U/L — ABNORMAL HIGH (ref 15–41)
Albumin: 4.1 g/dL (ref 3.5–5.0)
Alkaline Phosphatase: 66 U/L (ref 38–126)
Anion gap: 10 (ref 5–15)
BUN: 10 mg/dL (ref 6–20)
CO2: 26 mmol/L (ref 22–32)
Calcium: 9.1 mg/dL (ref 8.9–10.3)
Chloride: 99 mmol/L (ref 98–111)
Creatinine, Ser: 0.69 mg/dL (ref 0.61–1.24)
GFR, Estimated: >60 mL/min (ref >60)
Glucose, Bld: 151 mg/dL — ABNORMAL HIGH (ref 70–99)
Potassium: 3.5 mmol/L (ref 3.5–5.1)
Sodium: 135 mmol/L (ref 135–145)
Total Bilirubin: 1.3 mg/dL — ABNORMAL HIGH (ref 0.3–1.2)
Total Protein: 7.4 g/dL (ref 6.5–8.1)

## 2021-01-03 LAB — CBC
HCT: 41.7 % (ref 39.0–52.0)
Hemoglobin: 14.4 g/dL (ref 13.0–17.0)
MCH: 33.6 pg (ref 26.0–34.0)
MCHC: 34.5 g/dL (ref 30.0–36.0)
MCV: 97.2 fL (ref 80.0–100.0)
Platelets: 236 10*3/uL (ref 150–400)
RBC: 4.29 MIL/uL (ref 4.22–5.81)
RDW: 13.1 % (ref 11.5–15.5)
WBC: 5.5 10*3/uL (ref 4.0–10.5)
nRBC: 0 % (ref 0.0–0.2)

## 2021-01-03 LAB — LIPASE, BLOOD: Lipase: 67 U/L — ABNORMAL HIGH (ref 11–51)

## 2021-01-03 MED ORDER — ONDANSETRON 8 MG PO TBDP: 8.0000 mg | ORAL_TABLET | Freq: Three times a day (TID) | ORAL | 0 refills | Status: DC | PRN

## 2021-01-03 MED ORDER — ONDANSETRON HCL 4 MG/2ML IJ SOLN
4.0000 mg | Freq: Once | INTRAMUSCULAR | Status: AC
  Administered 2021-01-03: 4 mg via INTRAVENOUS
  Filled 2021-01-03: qty 2

## 2021-01-03 MED ORDER — HYDROMORPHONE HCL 1 MG/ML IJ SOLN
1.0000 mg | Freq: Once | INTRAMUSCULAR | Status: AC
  Administered 2021-01-03: 1 mg via INTRAVENOUS
  Filled 2021-01-03: qty 1

## 2021-01-03 MED ORDER — SODIUM CHLORIDE 0.9 % IV BOLUS
1000.0000 mL | Freq: Once | INTRAVENOUS | Status: AC
  Administered 2021-01-03: 1000 mL via INTRAVENOUS

## 2021-01-03 MED ORDER — FAMOTIDINE IN NACL 20-0.9 MG/50ML-% IV SOLN
20.0000 mg | Freq: Once | INTRAVENOUS | Status: AC
  Administered 2021-01-03: 20 mg via INTRAVENOUS
  Filled 2021-01-03: qty 50

## 2021-01-03 NOTE — ED Notes (Signed)
Given snack and soda for PO challenge.

## 2021-01-03 NOTE — ED Notes (Signed)
RN reviewed discharge instructions w/ pt. Follow up and prescriptions reviewed, pt had no further questions °

## 2021-01-03 NOTE — ED Notes (Signed)
Pt ambulatory back to stretcher, steady gait, EDP to BS. C/o NVD, and general abd pain.

## 2021-01-03 NOTE — ED Triage Notes (Signed)
Patient complains of right sided abdominal pain x 1 day with multiple episodes of emesis. Reports dark stool with same. Denies etoh use

## 2021-01-03 NOTE — ED Provider Notes (Signed)
[facility] EMERGENCY DEPARTMENT Provider Note   CSN: [id number] Arrival date & time: 01/03/21  2458     History Chief Complaint  Patient presents with   Vomiting   Abdominal Pain    Mario Jordan is a 35 y.o. male.  Patient c/o upper abd pain and nausea/vomiting for past day. Symptoms acute onset, moderate, constant, persistent, dull, non radiating. No bloody or bilious emesis. Stools dark brown, no constipation. No abd distension. No hx pud, gallstones or pancreatitis. +hx thc use. Denies chest pain or sob. No fever or chills. No back or flank pain.   The history is provided by the patient and medical records.      Past Medical History:  Diagnosis Date   Asthma 05/21/2017   Marijuana use 05/21/2017   Nicotine dependence 05/21/2017   Polysubstance abuse (HCC) 05/21/2017    Patient Active Problem List   Diagnosis Date Noted   Polysubstance abuse (HCC) 05/21/2017   Marijuana use 05/21/2017   Nicotine dependence 05/21/2017   Asthma 05/21/2017   Calcaneus fracture, right, tongue type fracture 05/20/2017    Past Surgical History:  Procedure Laterality Date   ORIF CALCANEOUS FRACTURE Right 05/20/2017   Procedure: OPEN REDUCTION INTERNAL FIXATION (ORIF) CALCANEOUS FRACTURE;  Surgeon: Myrene Galas, MD;  Location: [facility] OR;  Service: Orthopedics;  Laterality: Right;       Family History  Problem Relation Age of Onset   Healthy Mother    Healthy Father     Social History   Tobacco Use   Smoking status: Every Day    Types: Cigars   Smokeless tobacco: Never  Vaping Use   Vaping Use: Never used  Substance Use Topics   Alcohol use: Yes    Comment: occasion   Drug use: Yes    Types: Cocaine, Methamphetamines    Home Medications Prior to Admission medications   Medication Sig Start Date End Date Taking? Authorizing Provider  acetaminophen (TYLENOL) 500 MG tablet Take 1-2 tablets (500-1,000 mg total) by mouth every 6 (six) hours as needed  for mild pain. 05/21/17   Montez Morita, PA-C  Aspirin-Acetaminophen-Caffeine (EXCEDRIN EXTRA STRENGTH PO) Take 4 tablets by mouth once as needed (pain).    [provider]  docusate sodium (COLACE) 100 MG capsule Take 1 capsule (100 mg total) by mouth 2 (two) times daily. 05/21/17   Montez Morita, PA-C  hydrOXYzine (ATARAX/VISTARIL) 25 MG tablet Take 1 tablet (25 mg total) by mouth every 6 (six) hours. 02/22/20   Hall-Potvin, [place], PA-C  mirtazapine (REMERON) 30 MG tablet Take 40 mg by mouth at bedtime.    [provider]  OLANZapine (ZYPREXA) 20 MG tablet Take 20 mg by mouth daily.    [provider]  OLANZapine (ZYPREXA) 20 MG tablet Take 30 mg by mouth at bedtime.    [provider]  predniSONE (STERAPRED UNI-PAK 21 TAB) 10 MG (21) TBPK tablet Take by mouth daily. Take steroid taper as written 02/22/20   Hall-Potvin, [place], PA-C    Allergies    Patient has no known allergies.  Review of Systems   Review of Systems  Constitutional:  Negative for chills and fever.  HENT:  Negative for sore throat.   Eyes:  Negative for redness.  Respiratory:  Negative for shortness of breath.   Cardiovascular:  Negative for chest pain.  Gastrointestinal:  Positive for abdominal pain and vomiting. Negative for constipation and diarrhea.  Genitourinary:  Negative for dysuria and flank pain.  Musculoskeletal:  Negative for back pain and neck pain.  Skin:  Negative for rash.  Neurological:  Negative for headaches.  Hematological:  Does not bruise/bleed easily.  Psychiatric/Behavioral:  Negative for confusion.    Physical Exam Updated Vital Signs BP (!) 160/107 (BP Location: Right Arm)   Pulse 87   Temp 97.7 F (36.5 C) (Oral)   Resp 18   SpO2 99%   Physical Exam Vitals and nursing note reviewed.  Constitutional:      Appearance: Normal appearance. He is well-developed.  HENT:     Head: Atraumatic.     Nose: Nose normal.     Mouth/Throat:     Mouth:  Mucous membranes are moist.  Eyes:     General: No scleral icterus.    Conjunctiva/sclera: Conjunctivae normal.     Pupils: Pupils are equal, round, and reactive to light.  Neck:     Trachea: No tracheal deviation.  Cardiovascular:     Rate and Rhythm: Normal rate and regular rhythm.     Pulses: Normal pulses.     Heart sounds: Normal heart sounds. No murmur heard.   No friction rub. No gallop.  Pulmonary:     Effort: Pulmonary effort is normal. No accessory muscle usage or respiratory distress.     Breath sounds: Normal breath sounds.  Abdominal:     General: Bowel sounds are normal. There is no distension.     Palpations: Abdomen is soft.     Tenderness: There is abdominal tenderness. There is no guarding.     Comments: Epigastric tenderness.   Genitourinary:    Comments: No cva tenderness. Musculoskeletal:        General: No swelling.     Cervical back: Normal range of motion and neck supple. No rigidity.  Skin:    General: Skin is warm and dry.     Findings: No rash.  Neurological:     Mental Status: He is alert.     Comments: Alert, speech clear.   Psychiatric:        Mood and Affect: Mood normal.    ED Results / Procedures / Treatments   Labs (all labs ordered are listed, but only abnormal results are displayed) Results for orders placed or performed during the hospital encounter of 01/03/21  Lipase, blood  Result Value Ref Range   Lipase 67 (H) 11 - 51 U/L  Comprehensive metabolic panel  Result Value Ref Range   Sodium 135 135 - 145 mmol/L   Potassium 3.5 3.5 - 5.1 mmol/L   Chloride 99 98 - 111 mmol/L   CO2 26 22 - 32 mmol/L   Glucose, Bld 151 (H) 70 - 99 mg/dL   BUN 10 6 - 20 mg/dL   Creatinine, Ser 5.62 0.61 - 1.24 mg/dL   Calcium 9.1 8.9 - 56.3 mg/dL   Total Protein 7.4 6.5 - 8.1 g/dL   Albumin 4.1 3.5 - 5.0 g/dL   AST 54 (H) 15 - 41 U/L   ALT 35 0 - 44 U/L   Alkaline Phosphatase 66 38 - 126 U/L   Total Bilirubin 1.3 (H) 0.3 - 1.2 mg/dL   GFR,  Estimated >89 >37 mL/min   Anion gap 10 5 - 15  CBC  Result Value Ref Range   WBC 5.5 4.0 - 10.5 K/uL   RBC 4.29 4.22 - 5.81 MIL/uL   Hemoglobin 14.4 13.0 - 17.0 g/dL   HCT 34.2 87.6 - 81.1 %   MCV 97.2 80.0 - 100.0 fL  MCH 33.6 26.0 - 34.0 pg   MCHC 34.5 30.0 - 36.0 g/dL   RDW 27.7 41.2 - 87.8 %   Platelets 236 150 - 400 K/uL   nRBC 0.0 0.0 - 0.2 %  Urinalysis, Routine w reflex microscopic Urine, Clean Catch  Result Value Ref Range   Color, Urine YELLOW YELLOW   APPearance CLEAR CLEAR   Specific Gravity, Urine 1.015 1.005 - 1.030   pH 6.0 5.0 - 8.0   Glucose, UA NEGATIVE NEGATIVE mg/dL   Hgb urine dipstick NEGATIVE NEGATIVE   Bilirubin Urine NEGATIVE NEGATIVE   Ketones, ur NEGATIVE NEGATIVE mg/dL   Protein, ur NEGATIVE NEGATIVE mg/dL   Nitrite NEGATIVE NEGATIVE   Leukocytes,Ua NEGATIVE NEGATIVE     EKG None  Radiology No results found.  Procedures Procedures   Medications Ordered in ED Medications  HYDROmorphone (DILAUDID) injection 1 mg (1 mg Intravenous Given 01/03/21 1102)  ondansetron (ZOFRAN) injection 4 mg (4 mg Intravenous Given 01/03/21 1102)  famotidine (PEPCID) IVPB 20 mg premix (0 mg Intravenous Stopped 01/03/21 1118)  sodium chloride 0.9 % bolus 1,000 mL (0 mLs Intravenous Stopped 01/03/21 1220)    ED Course  I have reviewed the triage vital signs and the nursing notes.  Pertinent labs & imaging results that were available during my care of the patient were reviewed by me and considered in my medical decision making (see chart for details).    MDM Rules/Calculators/A&P                          Iv ns bolus. Dilaudid 1 mg iv. Zofran iv. Pepcid iv. Stat labs.   Reviewed nursing notes and prior charts for additional history.   Labs reviewed/interpreted by me - wbc normal.   Abd is soft and non tender. No recurrent vomiting. Trial of po.  Suspect possible cannabinoid hyperemesis syndrome.   Pt is tolerating po well. No nv. Abd soft nt.   Pt  currently appears stable for d/c.      Final Clinical Impression(s) / ED Diagnoses Final diagnoses:  Nausea and vomiting in adult  Upper abdominal pain    Rx / DC Orders ED Discharge Orders     None        Cathren Laine, MD 01/03/21 1308

## 2021-01-03 NOTE — Discharge Instructions (Signed)
It was our pleasure to provide your ER care today - we hope that you feel better.  Drink plenty of fluids/stay well hydrated. Take zofran as need for nausea.   Follow up with primary care doctor in 1-2 days if symptoms fail to improve/resolve.  Note that increasingly we are seeing marijuana/thc use as the cause of a recurrent upper abdominal pain and vomiting syndrome called Cannabinoid Hyperemesis Syndrome - see attached info - in these cases, avoiding marijuana use will prevent symptoms from recurring.   Return to ER if worse, new symptoms, fevers, persistent vomiting, worsening/severe pain, or other concern.  You were given pain meds in the ER - no driving for the next 8 hours.

## 2021-08-02 ENCOUNTER — Emergency Department (HOSPITAL_COMMUNITY)
Admission: EM | Admit: 2021-08-02 | Discharge: 2021-08-02 | Disposition: A | Discharge status: Home / Self Care | Payer: Self-pay | Attending: Emergency Medicine | Admitting: Emergency Medicine

## 2021-08-02 ENCOUNTER — Emergency Department (HOSPITAL_COMMUNITY): Payer: Self-pay

## 2021-08-02 ENCOUNTER — Encounter (HOSPITAL_COMMUNITY): Payer: Self-pay | Admitting: Emergency Medicine

## 2021-08-02 DIAGNOSIS — J4521 Mild intermittent asthma with (acute) exacerbation: Secondary | ICD-10-CM | POA: Insufficient documentation

## 2021-08-02 DIAGNOSIS — Z7952 Long term (current) use of systemic steroids: Secondary | ICD-10-CM | POA: Insufficient documentation

## 2021-08-02 LAB — BASIC METABOLIC PANEL
Anion gap: 11 (ref 5–15)
BUN: 11 mg/dL (ref 6–20)
CO2: 24 mmol/L (ref 22–32)
Calcium: 9 mg/dL (ref 8.9–10.3)
Chloride: 101 mmol/L (ref 98–111)
Creatinine, Ser: 0.7 mg/dL (ref 0.61–1.24)
GFR, Estimated: >60 mL/min (ref >60)
Glucose, Bld: 104 mg/dL — ABNORMAL HIGH (ref 70–99)
Potassium: 3.9 mmol/L (ref 3.5–5.1)
Sodium: 136 mmol/L (ref 135–145)

## 2021-08-02 LAB — CBC WITH DIFFERENTIAL/PLATELET
Abs Immature Granulocytes: 0.02 10*3/uL (ref 0.00–0.07)
Basophils Absolute: 0.1 10*3/uL (ref 0.0–0.1)
Basophils Relative: 1 %
Eosinophils Absolute: 0.2 10*3/uL (ref 0.0–0.5)
Eosinophils Relative: 4 %
HCT: 44.2 % (ref 39.0–52.0)
Hemoglobin: 15.6 g/dL (ref 13.0–17.0)
Immature Granulocytes: 0 %
Lymphocytes Relative: 24 %
Lymphs Abs: 1.2 10*3/uL (ref 0.7–4.0)
MCH: 33.7 pg (ref 26.0–34.0)
MCHC: 35.3 g/dL (ref 30.0–36.0)
MCV: 95.5 fL (ref 80.0–100.0)
Monocytes Absolute: 0.3 10*3/uL (ref 0.1–1.0)
Monocytes Relative: 6 %
Neutro Abs: 3.3 10*3/uL (ref 1.7–7.7)
Neutrophils Relative %: 65 %
Platelets: 250 10*3/uL (ref 150–400)
RBC: 4.63 MIL/uL (ref 4.22–5.81)
RDW: 13.4 % (ref 11.5–15.5)
WBC: 5.1 10*3/uL (ref 4.0–10.5)
nRBC: 0 % (ref 0.0–0.2)

## 2021-08-02 MED ORDER — ALBUTEROL SULFATE HFA 108 (90 BASE) MCG/ACT IN AERS
2.0000 | INHALATION_SPRAY | Freq: Once | RESPIRATORY_TRACT | Status: AC
  Administered 2021-08-02: 2 via RESPIRATORY_TRACT
  Filled 2021-08-02: qty 6.7

## 2021-08-02 MED ORDER — IPRATROPIUM-ALBUTEROL 0.5-2.5 (3) MG/3ML IN SOLN
3.0000 mL | Freq: Once | RESPIRATORY_TRACT | Status: AC
  Administered 2021-08-02: 3 mL via RESPIRATORY_TRACT
  Filled 2021-08-02: qty 3

## 2021-08-02 MED ORDER — DEXAMETHASONE SODIUM PHOSPHATE 10 MG/ML IJ SOLN
10.0000 mg | Freq: Once | INTRAMUSCULAR | Status: AC
  Administered 2021-08-02: 10 mg via INTRAMUSCULAR
  Filled 2021-08-02: qty 1

## 2021-08-02 NOTE — ED Notes (Signed)
An After Visit Summary was printed and given to the patient. Discharge instructions given and no further questions at this time.  

## 2021-08-02 NOTE — Discharge Instructions (Addendum)
Your lab work and x-ray looked very good today.  You had some mild wheezing, however the seem to have resolved with your breathing treatment and the albuterol inhaler.  If you feel yourself beginning to wheeze or become short of breath again, please use your albuterol inhaler at home.  If it does not improve, you may return to the ED for reevaluation. ?

## 2021-08-02 NOTE — ED Triage Notes (Signed)
Patient here from work reporting asthma flare due to "season change". Denies inhaler use. Nasal congestion and drainage with cough.  ?

## 2021-08-02 NOTE — ED Provider Notes (Signed)
?[facility]-EMERGENCY DEPT ?Provider Note ? ? ?CSN: [id number] ?Arrival date & time: 08/02/21  1601 ? ?  ? ?History ? ?Chief Complaint  ?Patient presents with  ? Asthma  ? ? ?Mario Jordan is a 36 y.o. male with history significant for asthma who presents to the ED for evaluation of wheezing and chest tightness over the last few days.  Patient states he feels as if his asthma is flared up during the season changes.  He also endorses nasal drainage and a cough.  Patient not taking anything at home for symptoms.  He currently has albuterol but he has not had that filled in quite a long while.  He denies shortness of breath, pain, nausea, vomiting diarrhea he has no other complaints. ? ? ?Asthma ? ? ?  ? ?Home Medications ?Prior to Admission medications   ?Medication Sig Start Date End Date Taking? Authorizing Provider  ?acetaminophen (TYLENOL) 500 MG tablet Take 1-2 tablets (500-1,000 mg total) by mouth every 6 (six) hours as needed for mild pain. 05/21/17   Montez Morita, PA-C  ?Aspirin-Acetaminophen-Caffeine (EXCEDRIN EXTRA STRENGTH PO) Take 4 tablets by mouth once as needed (pain).    [provider]  ?docusate sodium (COLACE) 100 MG capsule Take 1 capsule (100 mg total) by mouth 2 (two) times daily. 05/21/17   Montez Morita, PA-C  ?hydrOXYzine (ATARAX/VISTARIL) 25 MG tablet Take 1 tablet (25 mg total) by mouth every 6 (six) hours. 02/22/20   Hall-Potvin, [place], PA-C  ?mirtazapine (REMERON) 30 MG tablet Take 40 mg by mouth at bedtime.    [provider]  ?OLANZapine (ZYPREXA) 20 MG tablet Take 20 mg by mouth daily.    [provider]  ?OLANZapine (ZYPREXA) 20 MG tablet Take 30 mg by mouth at bedtime.    [provider]  ?ondansetron (ZOFRAN ODT) 8 MG disintegrating tablet Take 1 tablet (8 mg total) by mouth every 8 (eight) hours as needed for nausea or vomiting. 01/03/21   Cathren Laine, MD  ?predniSONE (STERAPRED UNI-PAK 21 TAB) 10 MG (21) TBPK tablet Take by  mouth daily. Take steroid taper as written 02/22/20   Hall-Potvin, [place], PA-C  ?   ? ?Allergies    ?Patient has no known allergies.   ? ?Review of Systems   ?Review of Systems ? ?Physical Exam ?Updated Vital Signs ?BP 122/76   Pulse 90   Temp 98.4 ?F (36.9 ?C) (Oral)   Resp 18   SpO2 99%  ?Physical Exam ?Vitals and nursing note reviewed.  ?Constitutional:   ?   General: He is not in acute distress. ?   Appearance: He is not ill-appearing.  ?HENT:  ?   Head: Atraumatic.  ?Eyes:  ?   Conjunctiva/sclera: Conjunctivae normal.  ?Cardiovascular:  ?   Rate and Rhythm: Normal rate and regular rhythm.  ?   Pulses: Normal pulses.  ?   Heart sounds: No murmur heard. ?Pulmonary:  ?   Effort: Pulmonary effort is normal. No respiratory distress.  ?   Breath sounds: Examination of the right-upper field reveals wheezing. Examination of the left-upper field reveals wheezing. Wheezing present.  ?   Comments: Mild expiratory wheezes of the bilateral upper lobes ?Abdominal:  ?   General: Abdomen is flat. There is no distension.  ?   Palpations: Abdomen is soft.  ?   Tenderness: There is no abdominal tenderness.  ?Musculoskeletal:     ?   General: Normal range of motion.  ?   Cervical back:  Normal range of motion.  ?Skin: ?   General: Skin is warm and dry.  ?   Capillary Refill: Capillary refill takes less than 2 seconds.  ?Neurological:  ?   General: No focal deficit present.  ?   Mental Status: He is alert.  ?Psychiatric:     ?   Mood and Affect: Mood normal.  ? ? ?ED Results / Procedures / Treatments   ?Labs ?(all labs ordered are listed, but only abnormal results are displayed) ?Labs Reviewed  ?BASIC METABOLIC PANEL - Abnormal; Notable for the following components:  ?    Result Value  ? Glucose, Bld 104 (*)   ? All other components within normal limits  ?CBC WITH DIFFERENTIAL/PLATELET  ? ? ?EKG ?None ? ?Radiology ?DG Chest 2 View ? ?Result Date: 08/02/2021 ?CLINICAL DATA:  Shortness of breath concern for asthma exacerbation.  EXAM: CHEST - 2 VIEW COMPARISON:  May 19, 2017 FINDINGS: The heart size and mediastinal contours are within normal limits. No focal consolidation. No pleural effusion. No pneumothorax. The visualized skeletal structures are unremarkable. IMPRESSION: No acute cardiopulmonary disease. Electronically Signed   By: Maudry Mayhew M.D.   On: 08/02/2021 17:30   ? ?Procedures ?Procedures  ? ? ?Medications Ordered in ED ?Medications  ?dexamethasone (DECADRON) injection 10 mg (10 mg Intramuscular Given 08/02/21 1653)  ?ipratropium-albuterol (DUONEB) 0.5-2.5 (3) MG/3ML nebulizer solution 3 mL (3 mLs Nebulization Given 08/02/21 1654)  ?albuterol (VENTOLIN HFA) 108 (90 Base) MCG/ACT inhaler 2 puff (2 puffs Inhalation Given 08/02/21 1654)  ? ? ?ED Course/ Medical Decision Making/ A&P ?  ?                        ?Medical Decision Making ?Amount and/or Complexity of Data Reviewed ?Labs: ordered. ?Radiology: ordered. ? ?Risk ?Prescription drug management. ? ? ?History:  ?Per HPI ?Social determinants of health: No primary care doctor ? ?Initial impression: ? ?This patient presents to the ED for concern of wheezing, this involves an extensive number of treatment options, and is a complaint that carries with it a high risk of complications and morbidity.    ?Patient is overall well-appearing in no acute distress.  Mildly tachycardic at 102 without fever.  Vitals are otherwise stable and he is oxygenating in the percent on room air.  He has mild expiratory wheezes in the bilateral upper lobes.  Will obtain basic labs, chest x-ray and administer DuoNeb treatment.  We will also give him albuterol inhaler to use at home ? ? ?Lab Tests and EKG: ? ?I Ordered, reviewed, and interpreted labs and EKG.  The pertinent results include:  ?BMP normal ?CBC without leukocytosis ? ? ?Imaging Studies ordered: ? ?I ordered imaging studies including  ?Chest Xray without acute findings  ?I independently visualized and interpreted imaging and I agree with  the radiologist interpretation.  ? ? ?Cardiac Monitoring: ? ?The patient was maintained on a cardiac monitor.  I personally viewed and interpreted the cardiac monitored which showed an underlying rhythm of: NSR ? ? ?Medicines ordered and prescription drug management: ? ?I ordered medication including: ?DuoNeb ?Albuterol inhaler x2 puffs ?Reevaluation of the patient after these medicines showed that the patient resolved ?I have reviewed the patients home medicines and have made adjustments as needed ? ? ? ?Disposition: ? ?After consideration of the diagnostic results, physical exam, history and the patients response to treatment feel that the patent would benefit from discharge.   ?Asthma exacerbation: Patient's wheezing fully resolved  with Duoneb and albuterol inhaler. Labwork and imaging reassuring. Patient is feeling better and requests discharge. Return precautions were discussed. Patient was discharged home in good condition. ? ? ?Final Clinical Impression(s) / ED Diagnoses ?Final diagnoses:  ?Mild intermittent asthma with exacerbation  ? ? ?Rx / DC Orders ?ED Discharge Orders   ? ? None  ? ?  ? ? ?  ?Janell Quiet, [place] ?08/02/21 1800 ? ?  ?Charlynne Pander, MD ?08/02/21 2324 ? ?

## 2021-09-02 ENCOUNTER — Encounter (HOSPITAL_COMMUNITY): Payer: Self-pay

## 2021-09-02 ENCOUNTER — Other Ambulatory Visit: Payer: Self-pay

## 2021-09-02 ENCOUNTER — Emergency Department (HOSPITAL_COMMUNITY)
Admission: EM | Admit: 2021-09-02 | Discharge: 2021-09-02 | Disposition: A | Discharge status: Home / Self Care | Payer: Self-pay | Attending: Emergency Medicine | Admitting: Emergency Medicine

## 2021-09-02 DIAGNOSIS — R55 Syncope and collapse: Secondary | ICD-10-CM | POA: Insufficient documentation

## 2021-09-02 DIAGNOSIS — W01198A Fall on same level from slipping, tripping and stumbling with subsequent striking against other object, initial encounter: Secondary | ICD-10-CM | POA: Insufficient documentation

## 2021-09-02 DIAGNOSIS — S0993XA Unspecified injury of face, initial encounter: Secondary | ICD-10-CM

## 2021-09-02 DIAGNOSIS — Y93G9 Activity, other involving cooking and grilling: Secondary | ICD-10-CM | POA: Insufficient documentation

## 2021-09-02 DIAGNOSIS — S0121XA Laceration without foreign body of nose, initial encounter: Secondary | ICD-10-CM | POA: Insufficient documentation

## 2021-09-02 LAB — BASIC METABOLIC PANEL
Anion gap: 8 (ref 5–15)
BUN: 14 mg/dL (ref 6–20)
CO2: 28 mmol/L (ref 22–32)
Calcium: 9 mg/dL (ref 8.9–10.3)
Chloride: 105 mmol/L (ref 98–111)
Creatinine, Ser: 0.91 mg/dL (ref 0.61–1.24)
GFR, Estimated: >60 mL/min (ref >60)
Glucose, Bld: 88 mg/dL (ref 70–99)
Potassium: 3.9 mmol/L (ref 3.5–5.1)
Sodium: 141 mmol/L (ref 135–145)

## 2021-09-02 LAB — CBC
HCT: 43.2 % (ref 39.0–52.0)
Hemoglobin: 14.7 g/dL (ref 13.0–17.0)
MCH: 32.8 pg (ref 26.0–34.0)
MCHC: 34 g/dL (ref 30.0–36.0)
MCV: 96.4 fL (ref 80.0–100.0)
Platelets: 295 10*3/uL (ref 150–400)
RBC: 4.48 MIL/uL (ref 4.22–5.81)
RDW: 13.2 % (ref 11.5–15.5)
WBC: 8.8 10*3/uL (ref 4.0–10.5)
nRBC: 0 % (ref 0.0–0.2)

## 2021-09-02 LAB — URINALYSIS, ROUTINE W REFLEX MICROSCOPIC
Bacteria, UA: NONE SEEN
Bilirubin Urine: NEGATIVE
Glucose, UA: NEGATIVE mg/dL
Hgb urine dipstick: NEGATIVE
Ketones, ur: NEGATIVE mg/dL
Leukocytes,Ua: NEGATIVE
Nitrite: NEGATIVE
Protein, ur: 30 mg/dL — AB
Specific Gravity, Urine: 1.029 (ref 1.005–1.030)
pH: 5 (ref 5.0–8.0)

## 2021-09-02 LAB — CBG MONITORING, ED
Glucose-Capillary: 93 mg/dL (ref 70–99)
Glucose-Capillary: 96 mg/dL (ref 70–99)

## 2021-09-02 MED ORDER — MUPIROCIN 2 % EX OINT
1.0000 | TOPICAL_OINTMENT | Freq: Two times a day (BID) | CUTANEOUS | 0 refills | Status: AC
Start: 2021-09-02 — End: 2021-09-13

## 2021-09-02 MED ORDER — BACITRACIN ZINC 500 UNIT/GM EX OINT
TOPICAL_OINTMENT | Freq: Two times a day (BID) | CUTANEOUS | Status: DC
  Filled 2021-09-02: qty 0.9

## 2021-09-02 MED ORDER — LIDOCAINE HCL 2 % IJ SOLN
10.0000 mL | Freq: Once | INTRAMUSCULAR | Status: AC
  Administered 2021-09-02: 200 mg
  Filled 2021-09-02: qty 20

## 2021-09-02 MED ORDER — HYDROGEN PEROXIDE 3 % EX SOLN
Freq: Every day | CUTANEOUS | Status: DC
  Filled 2021-09-02: qty 473

## 2021-09-02 NOTE — Discharge Instructions (Signed)
Your workup was benign at this time for your near-syncopal episode last night. Your nose was evaluated and was too late for repair at this time. Apply the topical ointment as prescribed. If you desire cosmetic repair in the future, recommend contacting a Engineer, petroleum.  ?

## 2021-09-02 NOTE — ED Triage Notes (Signed)
Patient states he was grilling last night around 2200 and had a syncopal episode. Patient states he fell face forward and injured his nose.  ?Patient denies taking any blood thinners. ?

## 2021-09-02 NOTE — ED Provider Notes (Signed)
?[facility]-EMERGENCY DEPT ?Provider Note ? ? ?CSN: [id number] ?Arrival date & time: 09/02/21  1529 ? ?  ? ?History ? ?Chief Complaint  ?Patient presents with  ? Loss of Consciousness  ? Facial Injury  ? ? ?Mario Jordan is a 36 y.o. male. Patient presents with a laceration on the nose after a near syncopal episode that occurred last night at 10pm. The patient states that he was using the grill and began to feel light headed. He states that he began to fall and hit his nose on the wooden side shelf on the grill. He denies complete loss of consciousness. The patient denies any symptoms at this time. Denies chest pain, shortness of breath, nausea, headache, abdominal pain. No dizziness or lightheadedness at this time.  ? ?HPI ? ?  ? ?Home Medications ?Prior to Admission medications   ?Medication Sig Start Date End Date Taking? Authorizing Provider  ?mupirocin ointment (BACTROBAN) 2 % Apply 1 application. topically 2 (two) times daily for 11 days. 09/02/21 09/13/21 Yes Darrick GrinderMcCauley, Zuleyka Kloc B, PA-C  ?acetaminophen (TYLENOL) 500 MG tablet Take 1-2 tablets (500-1,000 mg total) by mouth every 6 (six) hours as needed for mild pain. 05/21/17   Montez MoritaPaul, Keith, PA-C  ?Aspirin-Acetaminophen-Caffeine (EXCEDRIN EXTRA STRENGTH PO) Take 4 tablets by mouth once as needed (pain).    [provider]  ?docusate sodium (COLACE) 100 MG capsule Take 1 capsule (100 mg total) by mouth 2 (two) times daily. 05/21/17   Montez MoritaPaul, Keith, PA-C  ?hydrOXYzine (ATARAX/VISTARIL) 25 MG tablet Take 1 tablet (25 mg total) by mouth every 6 (six) hours. 02/22/20   Hall-Potvin, [place], PA-C  ?mirtazapine (REMERON) 30 MG tablet Take 40 mg by mouth at bedtime.    [provider]  ?OLANZapine (ZYPREXA) 20 MG tablet Take 20 mg by mouth daily.    [provider]  ?OLANZapine (ZYPREXA) 20 MG tablet Take 30 mg by mouth at bedtime.    [provider]  ?ondansetron (ZOFRAN ODT) 8 MG disintegrating tablet Take 1 tablet  (8 mg total) by mouth every 8 (eight) hours as needed for nausea or vomiting. 01/03/21   Cathren LaineSteinl, Kevin, MD  ?predniSONE (STERAPRED UNI-PAK 21 TAB) 10 MG (21) TBPK tablet Take by mouth daily. Take steroid taper as written 02/22/20   Hall-Potvin, [place], PA-C  ?   ? ?Allergies    ?Patient has no known allergies.   ? ?Review of Systems   ?Review of Systems  ?HENT:    ?     Nose injury  ?Respiratory:  Negative for shortness of breath.   ?Cardiovascular:  Negative for chest pain.  ?Skin:   ?     Nose laceration  ?Neurological:  Positive for light-headedness.  ?     Lightheadedness yesterday  ? ?Physical Exam ?Updated Vital Signs ?BP 125/84   Pulse 90   Temp 97.9 ?F (36.6 ?C) (Oral)   Resp 13   Ht 5\' 5"  (1.651 m)   Wt 63.5 kg   SpO2 95%   BMI 23.30 kg/m?  ?Physical Exam ?Constitutional:   ?   General: He is not in acute distress. ?HENT:  ?   Head: Normocephalic.  ?   Comments: Laceration down midline of nose, approximately 1cm.  Small laceration of right nare <.5cm ?Cardiovascular:  ?   Rate and Rhythm: Normal rate and regular rhythm.  ?   Pulses: Normal pulses.  ?   Heart sounds: Normal heart sounds.  ?Pulmonary:  ?   Effort: Pulmonary effort  is normal.  ?   Breath sounds: Normal breath sounds.  ?Abdominal:  ?   Palpations: Abdomen is soft.  ?Musculoskeletal:     ?   General: Normal range of motion.  ?   Cervical back: Normal range of motion.  ?Skin: ?   General: Skin is warm and dry.  ?Neurological:  ?   General: No focal deficit present.  ?   Mental Status: He is alert.  ? ? ?ED Results / Procedures / Treatments   ?Labs ?(all labs ordered are listed, but only abnormal results are displayed) ?Labs Reviewed  ?URINALYSIS, ROUTINE W REFLEX MICROSCOPIC - Abnormal; Notable for the following components:  ?    Result Value  ? Color, Urine AMBER (*)   ? Protein, ur 30 (*)   ? All other components within normal limits  ?BASIC METABOLIC PANEL  ?CBC  ?CBG MONITORING, ED  ?CBG MONITORING, ED  ? ? ?EKG ?EKG  Interpretation ? ?Date/Time:  Sunday September 02 2021 15:40:01 EDT ?Ventricular Rate:  101 ?PR Interval:  156 ?QRS Duration: 82 ?QT Interval:  317 ?QTC Calculation: 411 ?R Axis:   44 ?Text Interpretation: Sinus tachycardia Anterior infarct, old ST elevation, early repolarization. No old tracing to compare Confirmed by Arby Barrette [phone]) on 09/02/2021 3:46:14 PM ? ?Radiology ?No results found. ? ?Procedures ?Procedures  ? ? ?Medications Ordered in ED ?Medications  ?hydrogen peroxide 3 % external solution ( Topical Given 09/02/21 1743)  ?bacitracin ointment (has no administration in time range)  ?lidocaine (XYLOCAINE) 2 % (with pres) injection 200 mg (200 mg Other Given 09/02/21 1647)  ? ? ?ED Course/ Medical Decision Making/ A&P ?  ?                        ?Medical Decision Making ?Amount and/or Complexity of Data Reviewed ?Labs: ordered. ? ?Risk ?OTC drugs. ?Prescription drug management. ? ? ?This patient presents to the ED for concern of lightheadedness and a nose laceration, this involves an extensive number of treatment options, and is a complaint that carries with it a high risk of complications and morbidity.  The differential diagnosis includes electrolyte abnormality, dysrhythmia, hypotension, and others ? ? ?Co morbidities that complicate the patient evaluation ? ?None ? ? ?Additional history obtained: ? ?Additional history obtained from N/A ?External records from outside source obtained and reviewed including N/A ? ? ?Lab Tests: ? ?I Ordered, and personally interpreted labs.  The pertinent results include:  Protein urine 30 ? ? ?Cardiac Monitoring: / EKG: ? ?The patient was maintained on a cardiac monitor.  I personally viewed and interpreted the cardiac monitored which showed an underlying rhythm of: sinus rhythm ? ?Problem List / ED Course / Critical interventions / Medication management ? ? ?I ordered medication including lidocaine for topical anesthesia  ?Reevaluation of the patient after these medicines  showed that the patient improved ?I have reviewed the patients home medicines and have made adjustments as needed ? ?Test / Admission - Considered: ? ?The patient's workup shows no dysrhythmia. Labs are grossly normal. The patient no longer feels lightheaded and has not felt that way since last evening. I see no reason for further workup for the near syncopal episode from last night. If the patient has repeat episodes, it may warrant further evaluations ? ?The nose was cleaned thoroughly. After assessing the wound, it appeared that the healing process was too far advanced for a successful laceration repair. The wound was cleaned, bacitracin applied, and  dressed. The patient will be prescribed mupirocin ointment and may reach out to plastic surgery if desired  ? ?Final Clinical Impression(s) / ED Diagnoses ?Final diagnoses:  ?Syncope, unspecified syncope type  ?Facial injury, initial encounter  ? ? ?Rx / DC Orders ?ED Discharge Orders   ? ?      Ordered  ?  mupirocin ointment (BACTROBAN) 2 %  2 times daily       ? 09/02/21 1756  ? ?  ?  ? ?  ? ? ?  ?Darrick Grinder, PA-C ?09/02/21 1824 ? ?  ?Arby Barrette, MD ?09/13/21 0710 ? ?

## 2021-11-26 ENCOUNTER — Other Ambulatory Visit: Payer: Self-pay

## 2021-11-26 ENCOUNTER — Emergency Department (HOSPITAL_COMMUNITY): Payer: Self-pay

## 2021-11-26 ENCOUNTER — Emergency Department (HOSPITAL_COMMUNITY)
Admission: EM | Admit: 2021-11-26 | Discharge: 2021-11-26 | Disposition: A | Discharge status: Home / Self Care | Payer: Self-pay | Attending: Emergency Medicine | Admitting: Emergency Medicine

## 2021-11-26 ENCOUNTER — Encounter (HOSPITAL_COMMUNITY): Payer: Self-pay | Admitting: Emergency Medicine

## 2021-11-26 ENCOUNTER — Ambulatory Visit (HOSPITAL_COMMUNITY)
Admission: EM | Admit: 2021-11-26 | Discharge: 2021-11-26 | Disposition: A | Discharge status: Home / Self Care | Payer: Self-pay | Attending: Emergency Medicine | Admitting: Emergency Medicine

## 2021-11-26 DIAGNOSIS — S0592XA Unspecified injury of left eye and orbit, initial encounter: Secondary | ICD-10-CM | POA: Insufficient documentation

## 2021-11-26 DIAGNOSIS — Z2914 Encounter for prophylactic rabies immune globin: Secondary | ICD-10-CM | POA: Insufficient documentation

## 2021-11-26 DIAGNOSIS — Z23 Encounter for immunization: Secondary | ICD-10-CM | POA: Insufficient documentation

## 2021-11-26 DIAGNOSIS — W540XXA Bitten by dog, initial encounter: Secondary | ICD-10-CM

## 2021-11-26 MED ORDER — TETRACAINE HCL 0.5 % OP SOLN
OPHTHALMIC | Status: AC
  Filled 2021-11-26: qty 4

## 2021-11-26 MED ORDER — TETANUS-DIPHTH-ACELL PERTUSSIS 5-2.5-18.5 LF-MCG/0.5 IM SUSY
0.5000 mL | PREFILLED_SYRINGE | Freq: Once | INTRAMUSCULAR | Status: AC
  Administered 2021-11-26: 0.5 mL via INTRAMUSCULAR
  Filled 2021-11-26: qty 0.5

## 2021-11-26 MED ORDER — ERYTHROMYCIN 5 MG/GM OP OINT: TOPICAL_OINTMENT | OPHTHALMIC | 0 refills | Status: DC

## 2021-11-26 MED ORDER — EYE WASH OP SOLN
OPHTHALMIC | Status: AC
  Filled 2021-11-26: qty 118

## 2021-11-26 MED ORDER — IBUPROFEN 800 MG PO TABS
ORAL_TABLET | ORAL | Status: AC
  Filled 2021-11-26: qty 1

## 2021-11-26 MED ORDER — FLUORESCEIN SODIUM 1 MG OP STRP
ORAL_STRIP | OPHTHALMIC | Status: AC
  Filled 2021-11-26: qty 1

## 2021-11-26 MED ORDER — FLUORESCEIN SODIUM 1 MG OP STRP
1.0000 | ORAL_STRIP | Freq: Once | OPHTHALMIC | Status: AC
  Administered 2021-11-26: 1 via OPHTHALMIC
  Filled 2021-11-26: qty 1

## 2021-11-26 MED ORDER — TETRACAINE HCL 0.5 % OP SOLN
2.0000 [drp] | Freq: Once | OPHTHALMIC | Status: AC
  Administered 2021-11-26: 2 [drp] via OPHTHALMIC
  Filled 2021-11-26: qty 4

## 2021-11-26 MED ORDER — RABIES IMMUNE GLOBULIN 150 UNIT/ML IM INJ
1350.0000 [IU] | INJECTION | Freq: Once | INTRAMUSCULAR | Status: AC
Start: 2021-11-26 — End: 2021-11-26
  Administered 2021-11-26: 1350 [IU] via INTRAMUSCULAR
  Filled 2021-11-26: qty 10

## 2021-11-26 MED ORDER — AMOXICILLIN-POT CLAVULANATE 875-125 MG PO TABS: 1.0000 | ORAL_TABLET | Freq: Two times a day (BID) | ORAL | 0 refills | Status: DC

## 2021-11-26 MED ORDER — IBUPROFEN 800 MG PO TABS
800.0000 mg | ORAL_TABLET | Freq: Once | ORAL | Status: AC
  Administered 2021-11-26: 800 mg via ORAL

## 2021-11-26 MED ORDER — RABIES VACCINE, PCEC IM SUSR
1.0000 mL | Freq: Once | INTRAMUSCULAR | Status: AC
  Administered 2021-11-26: 1 mL via INTRAMUSCULAR
  Filled 2021-11-26: qty 1

## 2021-11-26 NOTE — ED Provider Notes (Signed)
[facility] EMERGENCY DEPARTMENT Provider Note   CSN: [id number] Arrival date & time: 11/26/21  1052     History  Chief Complaint  Patient presents with   Eye Injury    JHOEL STIEG is a 36 y.o. male who presents to the emergency department complaining of left eye injury onset 3 days ago. He notes that he was bit in his face by his friends dog. He is unsure of the status of the dogs vaccines. He is unsure of his tetanus vaccine. Has a prior remote injury to his left eye when he was a child that left him with decreased vision to his left eye. Notes that his vision is worse today. Wears glasses at baseline. No eye doctor. Was seen at Urgent Care and sent to the ED for further evaluation. No meds tried PTA. Denies    The history is provided by the patient. No language interpreter was used.       Home Medications Prior to Admission medications   Medication Sig Start Date End Date Taking? Authorizing Provider  amoxicillin-clavulanate (AUGMENTIN) 875-125 MG tablet Take 1 tablet by mouth every 12 (twelve) hours. 11/26/21  Yes Mikayla Chiusano A, PA-C  erythromycin ophthalmic ointment Place a 1/2 inch ribbon of ointment into the left lower eyelid four times daily for 3 days. 11/26/21  Yes Okema Rollinson A, PA-C  acetaminophen (TYLENOL) 500 MG tablet Take 1-2 tablets (500-1,000 mg total) by mouth every 6 (six) hours as needed for mild pain. 05/21/17   Montez Morita, PA-C  Aspirin-Acetaminophen-Caffeine (EXCEDRIN EXTRA STRENGTH PO) Take 4 tablets by mouth once as needed (pain).    [provider]  docusate sodium (COLACE) 100 MG capsule Take 1 capsule (100 mg total) by mouth 2 (two) times daily. 05/21/17   Montez Morita, PA-C  hydrOXYzine (ATARAX/VISTARIL) 25 MG tablet Take 1 tablet (25 mg total) by mouth every 6 (six) hours. 02/22/20   Hall-Potvin, [place], PA-C  mirtazapine (REMERON) 30 MG tablet Take 40 mg by mouth at bedtime.    [provider]  OLANZapine  (ZYPREXA) 20 MG tablet Take 20 mg by mouth daily.    [provider]  OLANZapine (ZYPREXA) 20 MG tablet Take 30 mg by mouth at bedtime.    [provider]  ondansetron (ZOFRAN ODT) 8 MG disintegrating tablet Take 1 tablet (8 mg total) by mouth every 8 (eight) hours as needed for nausea or vomiting. 01/03/21   Cathren Laine, MD  predniSONE (STERAPRED UNI-PAK 21 TAB) 10 MG (21) TBPK tablet Take by mouth daily. Take steroid taper as written 02/22/20   Hall-Potvin, [place], PA-C      Allergies    Patient has no known allergies.    Review of Systems   Review of Systems  Eyes:  Positive for pain, redness and visual disturbance. Negative for discharge.  Skin:  Negative for color change and wound.  All other systems reviewed and are negative.   Physical Exam Updated Vital Signs BP (!) 137/100 (BP Location: Right Arm)   Pulse 80   Temp 97.7 F (36.5 C) (Oral)   Resp 17   SpO2 100%  Physical Exam Vitals and nursing note reviewed.  Constitutional:      General: He is not in acute distress.    Appearance: Normal appearance. He is not ill-appearing.  HENT:     Head: Normocephalic and atraumatic.      Comments: TTP noted to     Nose: Nose normal. No congestion or  rhinorrhea.     Mouth/Throat:     Mouth: Mucous membranes are moist.     Pharynx: Oropharynx is clear. No oropharyngeal exudate or posterior oropharyngeal erythema.  Eyes:     General: Lids are everted, no foreign bodies appreciated. Vision grossly intact. No visual field deficit or scleral icterus.       Right eye: No foreign body or discharge.        Left eye: No foreign body or discharge.     Extraocular Movements: Extraocular movements intact.     Conjunctiva/sclera: Conjunctivae normal.     Pupils: Pupils are equal, round, and reactive to light.     Comments: Lids everted, no appreciated foreign bodies. EOMI. PERRL. No visual field deficit. No fluorescein uptake.   Cardiovascular:     Rate and Rhythm:  Normal rate.  Pulmonary:     Effort: Pulmonary effort is normal. No respiratory distress.  Musculoskeletal:        General: Normal range of motion.     Cervical back: Neck supple.  Skin:    General: Skin is warm and dry.     Findings: No rash.  Neurological:     Mental Status: He is alert.     ED Results / Procedures / Treatments   Labs (all labs ordered are listed, but only abnormal results are displayed) Labs Reviewed - No data to display  EKG None  Radiology CT Maxillofacial Wo Contrast  Result Date: 11/26/2021 CLINICAL DATA:  Dog bite to face.  Decreased vision in left eye EXAM: CT MAXILLOFACIAL WITHOUT CONTRAST TECHNIQUE: Multidetector CT imaging of the maxillofacial structures was performed. Multiplanar CT image reconstructions were also generated. RADIATION DOSE REDUCTION: This exam was performed according to the departmental dose-optimization program which includes automated exposure control, adjustment of the mA and/or kV according to patient size and/or use of iterative reconstruction technique. COMPARISON:  05/19/2017 FINDINGS: Osseous: No acute maxillofacial bone fracture. Bony orbital walls are intact. Mandible intact. Temporomandibular joints are aligned without dislocation. Orbits: Negative. No traumatic or inflammatory finding. No postseptal stranding or fluid. Globes appear intact. Sinuses: Clear. Soft tissues: Mild left supraorbital and infraorbital soft tissue swelling. There are a few tiny calcifications within the superficial soft tissues anterior to the mandible near the midline (series 4, images 9-12). Limited intracranial: No significant or unexpected finding. IMPRESSION: 1. Mild left supraorbital and infraorbital soft tissue swelling. No acute maxillofacial bone fracture. 2. No traumatic findings involving the left globe or left orbital structures. 3. There are a few tiny calcifications within the superficial soft tissues anterior to the mandible near the midline.  Findings are nonspecific, but may reflect retained foreign bodies in the setting of penetrating trauma. Electronically Signed   By: Duanne Guess D.O.   On: 11/26/2021 15:13    Procedures Procedures    Medications Ordered in ED Medications  tetracaine (PONTOCAINE) 0.5 % ophthalmic solution 2 drop (2 drops Left Eye Given by Other 11/26/21 1310)  fluorescein ophthalmic strip 1 strip (1 strip Left Eye Given by Other 11/26/21 1310)  rabies immune globulin (HYPERRAB/KEDRAB) injection 1,350 Units (1,350 Units Intramuscular Given 11/26/21 1432)  rabies vaccine (RABAVERT) injection 1 mL (1 mL Intramuscular Given 11/26/21 1429)  Tdap (BOOSTRIX) injection 0.5 mL (0.5 mLs Intramuscular Given 11/26/21 1434)    ED Course/ Medical Decision Making/ A&P Clinical Course as of 11/26/21 1526  Mon Nov 26, 2021  1516 Re-evaluated and discussed imaging findings as well as discharge treatment plan.  Patient appears safe for discharge  at this time. [SB]    Clinical Course User Index [SB] Haivyn Oravec A, PA-C                           Medical Decision Making Amount and/or Complexity of Data Reviewed Radiology: ordered.  Risk Prescription drug management.   Patient presents with left eye injury onset 3 days. Pt was bit by a friends dog, he is unaware of the rabies vaccine status of the dog.  Wears glasses.  Notes that vision changes have worsened since his remote injury. On exam, bilateral lids everted, no foreign bodies appreciated.  EOM conjunctiva intact.  Conjunctiva without injection.  No fluorescein uptake noted on exam.  Differential diagnosis includes corneal abrasion, corneal foreign body, hyphema.     Imaging: I ordered imaging studies including CT Maxillofacial  I independently visualized and interpreted imaging which showed:  1. Mild left supraorbital and infraorbital soft tissue swelling. No  acute maxillofacial bone fracture.  2. No traumatic findings involving the left globe or left  orbital  structures.  3. There are a few tiny calcifications within the superficial soft  tissues anterior to the mandible near the midline. Findings are  nonspecific, but may reflect retained foreign bodies in the setting  of penetrating trauma.   I agree with the radiologist interpretation  Medications:  I ordered medication including rabies vaccine, rabies immunoglobulin, tetanus vaccine for post-exposure prophylaxis I have reviewed the patients home medicines and have made adjustments as needed  Disposition: Presentation suspicious for dog bite, corneal abrasion. Fluorescein exam negative for acute foreign bodies or corneal abrasion.  Less likely hyphema at this time conjunctive a hemorrhage.  Less likely corneal foreign body, overt foreign body noted on exam.  This is likely corneal abrasion, patient with relief with tetracaine. After consideration of the diagnostic results and the patients response to treatment, I feel that the patient would benefit from Discharge home.  Erythromycin ointment prescription sent.  Patient also sent with a prescription for Augmentin.  Patient received first set of the rabies series today in the ED.  Tetanus updated in the ED.  Patient provided with information on when his next rabies vaccinations will be.  Provided with information for on-call eye specialist and informed to set up a follow-up appoint regarding today's ED visit.  Supportive care measures and strict return precautions discussed with patient at bedside. Pt acknowledges and verbalizes understanding. Pt appears safe for discharge. Follow up as indicated in discharge paperwork.    This chart was dictated using voice recognition software, Dragon. Despite the best efforts of this provider to proofread and correct errors, errors may still occur which can change documentation meaning.  Final Clinical Impression(s) / ED Diagnoses Final diagnoses:  Left eye injury, initial encounter    Rx / DC  Orders ED Discharge Orders          Ordered    erythromycin ophthalmic ointment        11/26/21 1523    amoxicillin-clavulanate (AUGMENTIN) 875-125 MG tablet  Every 12 hours        11/26/21 1523              Pailynn Vahey A, PA-C 11/26/21 1526    Lorre Nick, MD 11/29/21 [id number]

## 2021-11-26 NOTE — ED Triage Notes (Signed)
Pt reports that Saturday he and his brother were playing around and fell on the ground. Reports his dog attacked him while was on the ground. Pt reports that left eye was injured by dog and having trouble seeing out of it. Pt comes in with glasses on. Unknown last tetanus shot or if dog vaccines up to date.

## 2021-11-26 NOTE — ED Triage Notes (Signed)
Pt reports injury to L eye from a dog while playing with his brother on Saturday. Reports abnormal vision to same. Thinks has had a tetanus shot within five years. Sent here from [facility] for further eval.

## 2022-01-03 ENCOUNTER — Ambulatory Visit (HOSPITAL_COMMUNITY)
Admission: EM | Admit: 2022-01-03 | Discharge: 2022-01-03 | Disposition: A | Discharge status: Home / Self Care | Payer: Self-pay | Attending: Family Medicine | Admitting: Family Medicine

## 2022-01-03 ENCOUNTER — Encounter (HOSPITAL_COMMUNITY): Payer: Self-pay

## 2022-01-03 DIAGNOSIS — J4521 Mild intermittent asthma with (acute) exacerbation: Secondary | ICD-10-CM

## 2022-01-03 MED ORDER — IPRATROPIUM-ALBUTEROL 0.5-2.5 (3) MG/3ML IN SOLN
3.0000 mL | Freq: Once | RESPIRATORY_TRACT | Status: AC
  Administered 2022-01-03: 3 mL via RESPIRATORY_TRACT

## 2022-01-03 MED ORDER — PREDNISONE 20 MG PO TABS: 40.0000 mg | ORAL_TABLET | Freq: Every day | ORAL | 0 refills | Status: DC

## 2022-01-03 MED ORDER — IPRATROPIUM-ALBUTEROL 0.5-2.5 (3) MG/3ML IN SOLN
RESPIRATORY_TRACT | Status: AC
  Filled 2022-01-03: qty 3

## 2022-01-03 MED ORDER — ALBUTEROL SULFATE HFA 108 (90 BASE) MCG/ACT IN AERS
1.0000 | INHALATION_SPRAY | Freq: Four times a day (QID) | RESPIRATORY_TRACT | 2 refills | Status: DC | PRN
Start: 2022-01-03 — End: 2022-04-21

## 2022-01-03 NOTE — ED Triage Notes (Addendum)
Pt c/o asthma exacerbation x2 days. C/o chest tightness, wheezing, and body aches. States out of his inhaler and denies taking OTC meds. States needs a work note.

## 2022-01-03 NOTE — ED Provider Notes (Addendum)
[facility]ARE [facility]   [id number] 01/03/22 Arrival Time: 0955  ASSESSMENT & PLAN:  1. Mild intermittent asthma with exacerbation    Improved after duoneb. BP (!) 157/99 (BP Location: Left Arm)   Pulse 82   Temp 97.9 F (36.6 C) (Oral)   Resp 18   SpO2 99%    Meds ordered this encounter  Medications   ipratropium-albuterol (DUONEB) 0.5-2.5 (3) MG/3ML nebulizer solution 3 mL   predniSONE (DELTASONE) 20 MG tablet    Sig: Take 2 tablets (40 mg total) by mouth daily.    Dispense:  10 tablet    Refill:  0   albuterol (VENTOLIN HFA) 108 (90 Base) MCG/ACT inhaler    Sig: Inhale 1-2 puffs into the lungs every 6 (six) hours as needed for wheezing or shortness of breath.    Dispense:  1 each    Refill:  2   Asthma precautions given. OTC symptom care as needed.  Recommend:  Follow-up Information     [facility] EMERGENCY DEPARTMENT.   Specialty: Emergency Medicine Why: If symptoms worsen in any way. Contact information: [address] [phone] mc [place] [address] [phone]                Reviewed expectations re: course of current medical issues. Questions answered. Outlined signs and symptoms indicating need for more acute intervention. Patient verbalized understanding. After Visit Summary given.  SUBJECTIVE: History from: patient.  Mario Jordan is a 36 y.o. male who presents with complaint of fairly persistent chest tightness and wheezing. Onset abrupt,  2 d ago . Triggers: unsure. Fever: absent. No recent illness. Overall normal PO intake without n/v. Sick contacts: no. Ambulatory without difficulty. No LE edema. Typically his asthma is fairly well controlled. Inhaler use: needs refill. No tx PTA.  Social History   Tobacco Use  Smoking Status Some Days   Types: Cigarettes  Smokeless Tobacco Never    OBJECTIVE:  Vitals:   01/03/22 1022  BP: (!) 157/99  Pulse: 82  Resp: 18  Temp: 97.9 F (36.6  C)  TempSrc: Oral  SpO2: 99%     General appearance: alert; mild resp distress HEENT: Mario Jordan; AT; with mild nasal congestion Neck: supple without LAD Cv: RRR without murmer Lungs: unlabored respirations, moderate bilateral expiratory wheezing; cough: is present; speaks short sentences Skin: warm and dry Psychological: alert and cooperative; normal mood and affect   No Known Allergies  Past Medical History:  Diagnosis Date   Asthma 05/21/2017   Marijuana use 05/21/2017   Nicotine dependence 05/21/2017   Polysubstance abuse (HCC) 05/21/2017   Family History  Problem Relation Age of Onset   Healthy Mother    Healthy Father    Social History   Socioeconomic History   Marital status: Single    Spouse name: Not on file   Number of children: Not on file   Years of education: Not on file   Highest education level: Not on file  Occupational History   Not on file  Tobacco Use   Smoking status: Some Days    Types: Cigarettes   Smokeless tobacco: Never  Vaping Use   Vaping Use: Never used  Substance and Sexual Activity   Alcohol use: Yes    Comment: occasion   Drug use: Not Currently    Types: Cocaine, Methamphetamines   Sexual activity: Yes    Birth control/protection: Condom  Other Topics Concern   Not on file  Social History Narrative   **  Merged History Encounter **       Social Determinants of Health   Financial Resource Strain: Not on file  Food Insecurity: Not on file  Transportation Needs: Not on file  Physical Activity: Not on file  Stress: Not on file  Social Connections: Not on file  Intimate Partner Violence: Not on file             Mardella Layman, MD 01/03/22 1332    Mardella Layman, MD 01/03/22 1332

## 2022-04-20 ENCOUNTER — Ambulatory Visit (HOSPITAL_COMMUNITY): Admission: EM | Admit: 2022-04-20 | Discharge: 2022-04-20 | Discharge status: Against Medical Advice | Payer: Self-pay

## 2022-04-20 ENCOUNTER — Encounter (HOSPITAL_COMMUNITY): Payer: Self-pay

## 2022-04-20 NOTE — ED Triage Notes (Deleted)
Pt needs a return back to work note

## 2022-04-20 NOTE — ED Notes (Addendum)
Pt was called back twice no answer

## 2022-04-21 ENCOUNTER — Ambulatory Visit (HOSPITAL_COMMUNITY)
Admission: EM | Admit: 2022-04-21 | Discharge: 2022-04-21 | Disposition: A | Discharge status: Home / Self Care | Payer: Self-pay | Attending: Emergency Medicine | Admitting: Emergency Medicine

## 2022-04-21 ENCOUNTER — Encounter (HOSPITAL_COMMUNITY): Payer: Self-pay | Admitting: *Deleted

## 2022-04-21 ENCOUNTER — Other Ambulatory Visit: Payer: Self-pay

## 2022-04-21 DIAGNOSIS — J45901 Unspecified asthma with (acute) exacerbation: Secondary | ICD-10-CM

## 2022-04-21 MED ORDER — ALBUTEROL SULFATE (2.5 MG/3ML) 0.083% IN NEBU
2.5000 mg | INHALATION_SOLUTION | Freq: Once | RESPIRATORY_TRACT | Status: AC
  Administered 2022-04-21: 2.5 mg via RESPIRATORY_TRACT

## 2022-04-21 MED ORDER — PREDNISONE 10 MG PO TABS: ORAL_TABLET | ORAL | 0 refills | Status: DC

## 2022-04-21 MED ORDER — ALBUTEROL SULFATE HFA 108 (90 BASE) MCG/ACT IN AERS
2.0000 | INHALATION_SPRAY | Freq: Four times a day (QID) | RESPIRATORY_TRACT | 0 refills | Status: DC | PRN
Start: 2022-04-21 — End: 2023-02-04

## 2022-04-21 MED ORDER — ALBUTEROL SULFATE (2.5 MG/3ML) 0.083% IN NEBU
INHALATION_SOLUTION | RESPIRATORY_TRACT | Status: AC
Start: 2022-04-21 — End: ?
  Filled 2022-04-21: qty 3

## 2022-04-21 MED ORDER — CETIRIZINE HCL 10 MG PO TABS
10.0000 mg | ORAL_TABLET | Freq: Every day | ORAL | 0 refills | Status: DC
Start: 2022-04-21 — End: 2022-12-30

## 2022-04-21 NOTE — ED Provider Notes (Signed)
[facility]    CSN: [id number] Arrival date & time: 04/21/22  1010      History   Chief Complaint Chief Complaint  Patient presents with   Asthma    HPI Mario Jordan is a 36 y.o. male. Patient reports asthma flare that started 3 days ago. Patient reports wheezing and shortness or breath. Patient states he has ran out of his albuterol medication. Patient reports nonproductive cough at times.  Patient reports a history of unmanaged seasonal allergies.  Patient states he feels like the weather change triggered the asthma flare.  Patient states he is never followed up with a PCP on his asthma.  Patient was seen on January 03, 2022 for an asthma exacerbation at this clinic.  Patient states he experienced relief after breathing treatment and steroids that were given during that visit.  Patient denies any recent exposure to a viral illness that he is aware of.    Asthma Associated symptoms include shortness of breath. Pertinent negatives include no chest pain and no abdominal pain.    Past Medical History:  Diagnosis Date   Asthma 05/21/2017   Marijuana use 05/21/2017   Nicotine dependence 05/21/2017   Polysubstance abuse (HCC) 05/21/2017    Patient Active Problem List   Diagnosis Date Noted   Polysubstance abuse (HCC) 05/21/2017   Marijuana use 05/21/2017   Nicotine dependence 05/21/2017   Asthma 05/21/2017   Calcaneus fracture, right, tongue type fracture 05/20/2017    Past Surgical History:  Procedure Laterality Date   ORIF CALCANEOUS FRACTURE Right 05/20/2017   Procedure: OPEN REDUCTION INTERNAL FIXATION (ORIF) CALCANEOUS FRACTURE;  Surgeon: Myrene Galas, MD;  Location: [facility] OR;  Service: Orthopedics;  Laterality: Right;       Home Medications    Prior to Admission medications   Medication Sig Start Date End Date Taking? Authorizing Provider  albuterol (VENTOLIN HFA) 108 (90 Base) MCG/ACT inhaler Inhale 2 puffs into the lungs every 6 (six) hours as  needed for wheezing or shortness of breath. 04/21/22  Yes Debby Freiberg, NP  cetirizine (ZYRTEC ALLERGY) 10 MG tablet Take 1 tablet (10 mg total) by mouth daily. 04/21/22  Yes Debby Freiberg, NP  predniSONE (DELTASONE) 10 MG tablet Take 6 tablets on the first day, 5 tablets on the second day, 4 tablets on the third day, 3 tablets on the fourth day, 2 tablets on the fifth day, and 1 tablet on the last day. 04/21/22  Yes Debby Freiberg, NP  acetaminophen (TYLENOL) 500 MG tablet Take 1-2 tablets (500-1,000 mg total) by mouth every 6 (six) hours as needed for mild pain. 05/21/17   Montez Morita, PA-C  Aspirin-Acetaminophen-Caffeine (EXCEDRIN EXTRA STRENGTH PO) Take 4 tablets by mouth once as needed (pain).    [provider]  docusate sodium (COLACE) 100 MG capsule Take 1 capsule (100 mg total) by mouth 2 (two) times daily. 05/21/17   Montez Morita, PA-C  erythromycin ophthalmic ointment Place a 1/2 inch ribbon of ointment into the left lower eyelid four times daily for 3 days. 11/26/21   Blue, Soijett A, PA-C  mirtazapine (REMERON) 30 MG tablet Take 40 mg by mouth at bedtime.    [provider]  OLANZapine (ZYPREXA) 20 MG tablet Take 20 mg by mouth daily.    [provider]  OLANZapine (ZYPREXA) 20 MG tablet Take 30 mg by mouth at bedtime.    [provider]  ondansetron (ZOFRAN ODT) 8 MG disintegrating tablet Take 1 tablet (8  mg total) by mouth every 8 (eight) hours as needed for nausea or vomiting. 01/03/21   Cathren Laine, MD    Family History Family History  Problem Relation Age of Onset   Healthy Mother    Healthy Father     Social History Social History   Tobacco Use   Smoking status: Some Days    Types: Cigarettes   Smokeless tobacco: Never  Vaping Use   Vaping Use: Never used  Substance Use Topics   Alcohol use: Yes    Comment: occasion   Drug use: Not Currently    Types: Cocaine, Methamphetamines     Allergies   Patient has no  known allergies.   Review of Systems Review of Systems  Constitutional:  Negative for activity change, appetite change, chills, fatigue and fever.  HENT:  Negative for congestion, ear discharge, ear pain, postnasal drip, rhinorrhea, sinus pressure, sinus pain, sore throat and trouble swallowing.   Respiratory:  Positive for cough, chest tightness, shortness of breath and wheezing. Negative for apnea, choking and stridor.   Cardiovascular:  Negative for chest pain and palpitations.  Gastrointestinal:  Negative for abdominal pain, nausea and vomiting.     Physical Exam Triage Vital Signs ED Triage Vitals  Enc Vitals Group     BP 04/21/22 1047 134/84     Pulse Rate 04/21/22 1047 94     Resp 04/21/22 1047 18     Temp 04/21/22 1047 98.4 F (36.9 C)     Temp src --      SpO2 04/21/22 1047 100 %     Weight --      Height --      Head Circumference --      Peak Flow --      Pain Score 04/21/22 1045 7     Pain Loc --      Pain Edu? --      Excl. in GC? --    No data found.  Updated Vital Signs BP 134/84   Pulse 94   Temp 98.4 F (36.9 C)   Resp 18   SpO2 100%      Physical Exam Vitals and nursing note reviewed.  Constitutional:      Appearance: Normal appearance.  Cardiovascular:     Rate and Rhythm: Normal rate and regular rhythm.     Heart sounds: Normal heart sounds, S1 normal and S2 normal.  Pulmonary:     Effort: Pulmonary effort is normal.     Breath sounds: No stridor. Examination of the right-lower field reveals wheezing. Examination of the left-lower field reveals wheezing. Wheezing present. No rhonchi or rales.  Neurological:     Mental Status: He is alert.      UC Treatments / Results  Labs (all labs ordered are listed, but only abnormal results are displayed) Labs Reviewed - No data to display  EKG   Radiology No results found.  Procedures Procedures (including critical care time)  Medications Ordered in UC Medications  albuterol  (PROVENTIL) (2.5 MG/3ML) 0.083% nebulizer solution 2.5 mg (2.5 mg Nebulization Given 04/21/22 1112)    Initial Impression / Assessment and Plan / UC Course  I have reviewed the triage vital signs and the nursing notes.  Pertinent labs & imaging results that were available during my care of the patient were reviewed by me and considered in my medical decision making (see chart for details).     Patient evaluated for an asthma exacerbation.  Patient received a breathing  treatment in office and experienced relief of symptoms.  Prednisone, albuterol inhaler, and Zyrtec was sent to the pharmacy.  Patient was made aware of treatment regiment and possible side effects.  Patient was given information for a primary care provider and made aware that he should schedule an appointment to establish care and follow-up on his asthma.  Patient verbalized understanding of instructions.Patient was given a work note.  Charting was provided using a a verbal dictation system, charting was proofread for errors, errors may occur which could change the meaning of the information charted.   Final Clinical Impressions(s) / UC Diagnoses   Final diagnoses:  Mild asthma with exacerbation, unspecified whether persistent     Discharge Instructions      Prednisone taper has been sent to the pharmacy.  Zyrtec is an antihistamine, you will take this medication 1 time daily to help with seasonal allergies that could be flaring up your asthma.  You can use your albuterol inhaler every 6 hours as needed for shortness of breath and wheezing.  I have attached information for [facility] this is a primary care doctor that you can call and schedule an appointment with to follow-up on your asthma and also have an annual wellness visit.     ED Prescriptions     Medication Sig Dispense Auth. Provider   predniSONE (DELTASONE) 10 MG tablet Take 6 tablets on the first day, 5 tablets on the second  day, 4 tablets on the third day, 3 tablets on the fourth day, 2 tablets on the fifth day, and 1 tablet on the last day. 21 tablet Debby Freiberg, NP   cetirizine (ZYRTEC ALLERGY) 10 MG tablet Take 1 tablet (10 mg total) by mouth daily. 30 tablet Debby Freiberg, NP   albuterol (VENTOLIN HFA) 108 (90 Base) MCG/ACT inhaler Inhale 2 puffs into the lungs every 6 (six) hours as needed for wheezing or shortness of breath. 8 g Debby Freiberg, NP      PDMP not reviewed this encounter.   Debby Freiberg, NP 04/21/22 1141

## 2022-04-21 NOTE — Discharge Instructions (Addendum)
Prednisone taper has been sent to the pharmacy.  Zyrtec is an antihistamine, you will take this medication 1 time daily to help with seasonal allergies that could be flaring up your asthma.  You can use your albuterol inhaler every 6 hours as needed for shortness of breath and wheezing.  I have attached information for [facility] this is a primary care doctor that you can call and schedule an appointment with to follow-up on your asthma and also have an annual wellness visit.

## 2022-04-21 NOTE — ED Triage Notes (Signed)
Pt reports his asthma flared up 3 days ago.

## 2022-09-22 ENCOUNTER — Ambulatory Visit (HOSPITAL_COMMUNITY)
Admission: EM | Admit: 2022-09-22 | Discharge: 2022-09-22 | Disposition: A | Discharge status: Home / Self Care | Payer: Self-pay | Attending: Physician Assistant | Admitting: Physician Assistant

## 2022-09-22 ENCOUNTER — Other Ambulatory Visit: Payer: Self-pay

## 2022-09-22 ENCOUNTER — Encounter (HOSPITAL_COMMUNITY): Payer: Self-pay | Admitting: Emergency Medicine

## 2022-09-22 ENCOUNTER — Ambulatory Visit (INDEPENDENT_AMBULATORY_CARE_PROVIDER_SITE_OTHER): Payer: Self-pay

## 2022-09-22 DIAGNOSIS — S90511A Abrasion, right ankle, initial encounter: Secondary | ICD-10-CM

## 2022-09-22 DIAGNOSIS — M25571 Pain in right ankle and joints of right foot: Secondary | ICD-10-CM

## 2022-09-22 DIAGNOSIS — L03115 Cellulitis of right lower limb: Secondary | ICD-10-CM

## 2022-09-22 DIAGNOSIS — Z23 Encounter for immunization: Secondary | ICD-10-CM

## 2022-09-22 MED ORDER — TETANUS-DIPHTH-ACELL PERTUSSIS 5-2.5-18.5 LF-MCG/0.5 IM SUSY
PREFILLED_SYRINGE | INTRAMUSCULAR | Status: AC
  Filled 2022-09-22: qty 0.5

## 2022-09-22 MED ORDER — DOXYCYCLINE HYCLATE 100 MG PO CAPS: 100.0000 mg | ORAL_CAPSULE | Freq: Two times a day (BID) | ORAL | 0 refills | Status: DC

## 2022-09-22 MED ORDER — TETANUS-DIPHTH-ACELL PERTUSSIS 5-2.5-18.5 LF-MCG/0.5 IM SUSY
0.5000 mL | PREFILLED_SYRINGE | Freq: Once | INTRAMUSCULAR | Status: AC
  Administered 2022-09-22: 0.5 mL via INTRAMUSCULAR

## 2022-09-22 MED ORDER — IBUPROFEN 600 MG PO TABS: 600.0000 mg | ORAL_TABLET | Freq: Four times a day (QID) | ORAL | 0 refills | Status: DC | PRN

## 2022-09-22 NOTE — ED Provider Notes (Signed)
[facility]    CSN: [id number] Arrival date & time: 09/22/22  1034      History   Chief Complaint No chief complaint on file.   HPI Mario Jordan is a 37 y.o. male.   37 year old male presents with right ankle pain.  Patient indicates 2 days ago he was playing with his children when his right ankle hit the metal support on the bed causing an open abrasion.  Patient indicates that for the past 2 days the area has become more painful, redness, swelling, and mild drainage from the area.  Patient indicates that he gets pain when he tries to walk and this is causing him to limp.  Patient indicates there is mild drainage from the area.  He is without fever or chills.  He has been taking OTC medications without relief.  Patient indicates that there is been a fracture in the past with screw placement of the right heel/ankle area.  Patient indicates he does not recall his last tetanus.     Past Medical History:  Diagnosis Date   Asthma 05/21/2017   Marijuana use 05/21/2017   Nicotine dependence 05/21/2017   Polysubstance abuse 05/21/2017    Patient Active Problem List   Diagnosis Date Noted   Polysubstance abuse 05/21/2017   Marijuana use 05/21/2017   Nicotine dependence 05/21/2017   Asthma 05/21/2017   Calcaneus fracture, right, tongue type fracture 05/20/2017    Past Surgical History:  Procedure Laterality Date   ORIF CALCANEOUS FRACTURE Right 05/20/2017   Procedure: OPEN REDUCTION INTERNAL FIXATION (ORIF) CALCANEOUS FRACTURE;  Surgeon: Myrene Galas, MD;  Location: [facility] OR;  Service: Orthopedics;  Laterality: Right;       Home Medications    Prior to Admission medications   Medication Sig Start Date End Date Taking? Authorizing Provider  doxycycline (VIBRAMYCIN) 100 MG capsule Take 1 capsule (100 mg total) by mouth 2 (two) times daily. 09/22/22  Yes Ellsworth Lennox, PA-C  ibuprofen (ADVIL) 600 MG tablet Take 1 tablet (600 mg total) by mouth every 6 (six)  hours as needed. 09/22/22  Yes Ellsworth Lennox, PA-C  acetaminophen (TYLENOL) 500 MG tablet Take 1-2 tablets (500-1,000 mg total) by mouth every 6 (six) hours as needed for mild pain. 05/21/17   Montez Morita, PA-C  albuterol (VENTOLIN HFA) 108 (90 Base) MCG/ACT inhaler Inhale 2 puffs into the lungs every 6 (six) hours as needed for wheezing or shortness of breath. 04/21/22   Debby Freiberg, NP  Aspirin-Acetaminophen-Caffeine (EXCEDRIN EXTRA STRENGTH PO) Take 4 tablets by mouth once as needed (pain).    [provider]  cetirizine (ZYRTEC ALLERGY) 10 MG tablet Take 1 tablet (10 mg total) by mouth daily. 04/21/22   Debby Freiberg, NP  docusate sodium (COLACE) 100 MG capsule Take 1 capsule (100 mg total) by mouth 2 (two) times daily. 05/21/17   Montez Morita, PA-C  erythromycin ophthalmic ointment Place a 1/2 inch ribbon of ointment into the left lower eyelid four times daily for 3 days. 11/26/21   Blue, Soijett A, PA-C  mirtazapine (REMERON) 30 MG tablet Take 40 mg by mouth at bedtime.    [provider]  OLANZapine (ZYPREXA) 20 MG tablet Take 20 mg by mouth daily.    [provider]  OLANZapine (ZYPREXA) 20 MG tablet Take 30 mg by mouth at bedtime.    [provider]  ondansetron (ZOFRAN ODT) 8 MG disintegrating tablet Take 1 tablet (8 mg total) by mouth every 8 (eight) hours  as needed for nausea or vomiting. 01/03/21   Cathren Laine, MD  predniSONE (DELTASONE) 10 MG tablet Take 6 tablets on the first day, 5 tablets on the second day, 4 tablets on the third day, 3 tablets on the fourth day, 2 tablets on the fifth day, and 1 tablet on the last day. Patient not taking: Reported on 09/22/2022 04/21/22   Debby Freiberg, NP    Family History Family History  Problem Relation Age of Onset   Healthy Mother    Healthy Father     Social History Social History   Tobacco Use   Smoking status: Some Days    Types: Cigarettes   Smokeless tobacco: Never  Vaping Use    Vaping Use: Never used  Substance Use Topics   Alcohol use: Yes    Comment: occasion   Drug use: Not Currently    Types: Cocaine, Methamphetamines     Allergies   Patient has no known allergies.   Review of Systems Review of Systems  Musculoskeletal:  Positive for joint swelling (right ankle lateral malleolus).     Physical Exam Triage Vital Signs ED Triage Vitals  Enc Vitals Group     BP 09/22/22 1114 122/84     Pulse Rate 09/22/22 1114 (!) 104     Resp 09/22/22 1114 18     Temp 09/22/22 1114 97.8 F (36.6 C)     Temp Source 09/22/22 1114 Oral     SpO2 09/22/22 1114 98 %     Weight --      Height --      Head Circumference --      Peak Flow --      Pain Score 09/22/22 1111 10     Pain Loc --      Pain Edu? --      Excl. in GC? --    No data found.  Updated Vital Signs BP 122/84 (BP Location: Right Arm)   Pulse (!) 104   Temp 97.8 F (36.6 C) (Oral)   Resp 18   SpO2 98%   Visual Acuity Right Eye Distance:   Left Eye Distance:   Bilateral Distance:    Right Eye Near:   Left Eye Near:    Bilateral Near:   Right Ankle:     Physical Exam Constitutional:      Appearance: Normal appearance.  Musculoskeletal:       Legs:     Comments: Right ankle: Range of motion is limited with pain on flexion, extension and rotation.  Stability is intact.  1+ swelling noted on the lateral malleolus, mild abrasion present on the mid lateral malleolus that is 0.25 cm without active drainage.  There is no streaking of the redness as it is localized around the lateral malleolus.  (Refer to picture)  Neurological:     Mental Status: He is alert.      UC Treatments / Results  Labs (all labs ordered are listed, but only abnormal results are displayed) Labs Reviewed - No data to display  EKG   Radiology DG Ankle Complete Right  Result Date: 09/22/2022 CLINICAL DATA:  Hit ankle on bed rail 2 days ago. Pain and swelling. EXAM: RIGHT ANKLE - COMPLETE 3 VIEW  COMPARISON:  Right foot radiographs 05/20/2017 FINDINGS: Calcaneal ORIF noted. Soft swelling is present over the lateral malleolus. No underlying fracture present. Radiopaque foreign body is present. No significant ankle joint effusion present. IMPRESSION: 1. Soft swelling over the lateral malleolus without  underlying fracture. 2. ORIF of the calcaneus. Electronically Signed   By: Marin Roberts M.D.   On: 09/22/2022 11:33    Procedures Procedures (including critical care time)  Medications Ordered in UC Medications  Tdap (BOOSTRIX) injection 0.5 mL (0.5 mLs Intramuscular Given 09/22/22 1135)    Initial Impression / Assessment and Plan / UC Course  I have reviewed the triage vital signs and the nursing notes.  Pertinent labs & imaging results that were available during my care of the patient were reviewed by me and considered in my medical decision making (see chart for details).    Plan: The diagnosis to be treated with the following: 1.  Abrasion right ankle: A.  Dressing and Ace wrap applied in the office today. B.  Doxycycline 100 mg every 12 hours to treat infection. C.  Tdap was given in the office today. 2.  Cellulitis right ankle: A.  Doxycycline 100 mg every 12 hours to treat infection. 3.  Right ankle pain: A.  Ibuprofen 600 mg every 6 hours with food to help treat pain and discomfort. 4.  Patient advised follow-up PCP return to urgent care if symptoms fail to improve. Final Clinical Impressions(s) / UC Diagnoses   Final diagnoses:  Abrasion of right ankle, initial encounter  Cellulitis of right ankle  Acute right ankle pain     Discharge Instructions      Advised take ibuprofen 600 mg every 6 hours with food to help decrease pain and swelling. Advised take the doxycycline 100 mg every 12 hours to help treat infection. Advised to use Epsom salt soaks, 1 tablespoon lukewarm water, soak for 10 minutes, 3-4 times throughout the day to help reduce pain and  discomfort. Advised to keep the wound area covered, use Ace wrap to help with added support.  Advised follow-up PCP return to urgent care if symptoms fail to improve within the next 7 to 10 days.    ED Prescriptions     Medication Sig Dispense Auth. Provider   doxycycline (VIBRAMYCIN) 100 MG capsule Take 1 capsule (100 mg total) by mouth 2 (two) times daily. 20 capsule Ellsworth Lennox, PA-C   ibuprofen (ADVIL) 600 MG tablet Take 1 tablet (600 mg total) by mouth every 6 (six) hours as needed. 30 tablet Ellsworth Lennox, PA-C      PDMP not reviewed this encounter.   Ellsworth Lennox, PA-C 09/22/22 1149

## 2022-09-22 NOTE — Discharge Instructions (Signed)
Advised take ibuprofen 600 mg every 6 hours with food to help decrease pain and swelling. Advised take the doxycycline 100 mg every 12 hours to help treat infection. Advised to use Epsom salt soaks, 1 tablespoon lukewarm water, soak for 10 minutes, 3-4 times throughout the day to help reduce pain and discomfort. Advised to keep the wound area covered, use Ace wrap to help with added support.  Advised follow-up PCP return to urgent care if symptoms fail to improve within the next 7 to 10 days.

## 2022-09-22 NOTE — ED Triage Notes (Signed)
Hit right ankle on bed rail 2 days ago.  Right ankle is swollen, red, small wound on lateral ankle.    6 years ago had an injury to this ankle and reports screws in ankle.    Patient reports taking advil and tylenol

## 2022-10-09 ENCOUNTER — Encounter (HOSPITAL_COMMUNITY): Payer: Self-pay

## 2022-10-09 ENCOUNTER — Ambulatory Visit (HOSPITAL_COMMUNITY)
Admission: EM | Admit: 2022-10-09 | Discharge: 2022-10-09 | Disposition: A | Discharge status: Home / Self Care | Payer: Self-pay | Attending: Physician Assistant | Admitting: Physician Assistant

## 2022-10-09 DIAGNOSIS — T148XXA Other injury of unspecified body region, initial encounter: Secondary | ICD-10-CM | POA: Insufficient documentation

## 2022-10-09 DIAGNOSIS — L089 Local infection of the skin and subcutaneous tissue, unspecified: Secondary | ICD-10-CM | POA: Insufficient documentation

## 2022-10-09 DIAGNOSIS — S31819A Unspecified open wound of right buttock, initial encounter: Secondary | ICD-10-CM | POA: Insufficient documentation

## 2022-10-09 LAB — CBC WITH DIFFERENTIAL/PLATELET
Abs Immature Granulocytes: 0.04 10*3/uL (ref 0.00–0.07)
Basophils Absolute: 0.1 10*3/uL (ref 0.0–0.1)
Basophils Relative: 1 %
Eosinophils Absolute: 0.2 10*3/uL (ref 0.0–0.5)
Eosinophils Relative: 3 %
HCT: 36.7 % — ABNORMAL LOW (ref 39.0–52.0)
Hemoglobin: 12.6 g/dL — ABNORMAL LOW (ref 13.0–17.0)
Immature Granulocytes: 1 %
Lymphocytes Relative: 14 %
Lymphs Abs: 1 10*3/uL (ref 0.7–4.0)
MCH: 32.7 pg (ref 26.0–34.0)
MCHC: 34.3 g/dL (ref 30.0–36.0)
MCV: 95.3 fL (ref 80.0–100.0)
Monocytes Absolute: 0.7 10*3/uL (ref 0.1–1.0)
Monocytes Relative: 9 %
Neutro Abs: 5.2 10*3/uL (ref 1.7–7.7)
Neutrophils Relative %: 72 %
Platelets: 317 10*3/uL (ref 150–400)
RBC: 3.85 MIL/uL — ABNORMAL LOW (ref 4.22–5.81)
RDW: 13.4 % (ref 11.5–15.5)
WBC: 7.2 10*3/uL (ref 4.0–10.5)
nRBC: 0 % (ref 0.0–0.2)

## 2022-10-09 LAB — COMPREHENSIVE METABOLIC PANEL
ALT: 26 U/L (ref 0–44)
AST: 35 U/L (ref 15–41)
Albumin: 3.1 g/dL — ABNORMAL LOW (ref 3.5–5.0)
Alkaline Phosphatase: 65 U/L (ref 38–126)
Anion gap: 8 (ref 5–15)
BUN: 6 mg/dL (ref 6–20)
CO2: 26 mmol/L (ref 22–32)
Calcium: 8.8 mg/dL — ABNORMAL LOW (ref 8.9–10.3)
Chloride: 103 mmol/L (ref 98–111)
Creatinine, Ser: 0.66 mg/dL (ref 0.61–1.24)
GFR, Estimated: >60 mL/min (ref >60)
Glucose, Bld: 97 mg/dL (ref 70–99)
Potassium: 4.2 mmol/L (ref 3.5–5.1)
Sodium: 137 mmol/L (ref 135–145)
Total Bilirubin: 0.7 mg/dL (ref 0.3–1.2)
Total Protein: 6.4 g/dL — ABNORMAL LOW (ref 6.5–8.1)

## 2022-10-09 LAB — HIV ANTIBODY (ROUTINE TESTING W REFLEX): HIV Screen 4th Generation wRfx: NONREACTIVE

## 2022-10-09 MED ORDER — SULFAMETHOXAZOLE-TRIMETHOPRIM 800-160 MG PO TABS: 1.0000 | ORAL_TABLET | Freq: Two times a day (BID) | ORAL | 0 refills | Status: AC

## 2022-10-09 MED ORDER — AMOXICILLIN-POT CLAVULANATE 875-125 MG PO TABS: 1.0000 | ORAL_TABLET | Freq: Two times a day (BID) | ORAL | 0 refills | Status: DC

## 2022-10-09 NOTE — Discharge Instructions (Signed)
You have an infected wound.  Please start Augmentin twice daily for 7 days and Bactrim DS twice daily for 7 days.  If you develop any rash or lesions stop the medication to be seen immediately.  Keep this area clean with soap and water.  I do recommend that you stop using alcohol and hydrogen peroxide because at this point it is probably delaying the healing.  If your symptoms or not improving very quickly with antibiotics please follow-up with surgeon for further evaluation and management including potentially imaging.  Call them to schedule an appointment.  I will contact you for lab work is abnormal.  Continue alternating Tylenol ibuprofen over-the-counter for pain relief.  If you have any worsening or changing symptoms including enlarging lesion, fever, increasing pain, difficulty passing a bowel movement, nausea/vomiting you need to go to the emergency room immediately.

## 2022-10-09 NOTE — ED Triage Notes (Signed)
Patient reports that he has an abscess to the posterior right upper leg at base of the right buttock area x 1 week. Patient states the abscess is draining.

## 2022-10-09 NOTE — ED Provider Notes (Signed)
[facility]    CSN: [id number] Arrival date & time: 10/09/22  1032      History   Chief Complaint Chief Complaint  Patient presents with   Abscess    HPI Mario Jordan is a 37 y.o. male.   Patient presents today with a 1 week history of draining wound on his right buttocks.  He initially thought this was a small ingrown hair but this has gradually been worsening and has had ongoing continuous purulent and serosanguineous drainage.  He has been cleaning area with soap and water and applying hydroperoxide when symptoms first began.  He denies episodes of recurrent skin infections or history of MRSA.  Denies history of diabetes, HIV, active chemotherapy, other immunosuppression.  He reports that pain is rated 9 on a 0-10 pain scale, described as pressure, no aggravating relieving factors identified.  He denies history of Crohn's disease or previous anal fistula.  He was treated with doxycycline several weeks ago for an unrelated reason and denies additional antibiotics in the past 90 days.  He has been taking Tylenol with minimal improvement of symptoms.    Past Medical History:  Diagnosis Date   Asthma 05/21/2017   Marijuana use 05/21/2017   Nicotine dependence 05/21/2017   Polysubstance abuse (HCC) 05/21/2017    Patient Active Problem List   Diagnosis Date Noted   Polysubstance abuse (HCC) 05/21/2017   Marijuana use 05/21/2017   Nicotine dependence 05/21/2017   Asthma 05/21/2017   Calcaneus fracture, right, tongue type fracture 05/20/2017    Past Surgical History:  Procedure Laterality Date   ORIF CALCANEOUS FRACTURE Right 05/20/2017   Procedure: OPEN REDUCTION INTERNAL FIXATION (ORIF) CALCANEOUS FRACTURE;  Surgeon: Myrene Galas, MD;  Location: [facility];  Service: Orthopedics;  Laterality: Right;       Home Medications    Prior to Admission medications   Medication Sig Start Date End Date Taking? Authorizing Provider  amoxicillin-clavulanate  (AUGMENTIN) 875-125 MG tablet Take 1 tablet by mouth every 12 (twelve) hours. 10/09/22  Yes Agustine Rossitto K, PA-C  sulfamethoxazole-trimethoprim (BACTRIM DS) 800-160 MG tablet Take 1 tablet by mouth 2 (two) times daily for 7 days. 10/09/22 10/16/22 Yes Audi Wettstein, Noberto Retort, PA-C  acetaminophen (TYLENOL) 500 MG tablet Take 1-2 tablets (500-1,000 mg total) by mouth every 6 (six) hours as needed for mild pain. 05/21/17   Montez Morita, PA-C  albuterol (VENTOLIN HFA) 108 (90 Base) MCG/ACT inhaler Inhale 2 puffs into the lungs every 6 (six) hours as needed for wheezing or shortness of breath. 04/21/22   Debby Freiberg, NP  Aspirin-Acetaminophen-Caffeine (EXCEDRIN EXTRA STRENGTH PO) Take 4 tablets by mouth once as needed (pain).    [provider]  cetirizine (ZYRTEC ALLERGY) 10 MG tablet Take 1 tablet (10 mg total) by mouth daily. 04/21/22   Debby Freiberg, NP  ibuprofen (ADVIL) 600 MG tablet Take 1 tablet (600 mg total) by mouth every 6 (six) hours as needed. 09/22/22   Ellsworth Lennox, PA-C    Family History Family History  Problem Relation Age of Onset   Healthy Mother    Healthy Father     Social History Social History   Tobacco Use   Smoking status: Some Days    Types: Cigarettes   Smokeless tobacco: Never  Vaping Use   Vaping Use: Never used  Substance Use Topics   Alcohol use: Yes    Comment: occasion   Drug use: Not Currently    Types: Cocaine, Methamphetamines  Allergies   Patient has no known allergies.   Review of Systems Review of Systems  Constitutional:  Positive for activity change. Negative for appetite change, fatigue and fever.  Gastrointestinal:  Negative for abdominal pain, diarrhea, nausea and vomiting.  Musculoskeletal:  Negative for arthralgias and myalgias.  Skin:  Positive for color change and wound.     Physical Exam Triage Vital Signs ED Triage Vitals  Enc Vitals Group     BP 10/09/22 1131 125/77     Pulse Rate 10/09/22 1131 81     Resp  10/09/22 1131 16     Temp 10/09/22 1131 98.1 F (36.7 C)     Temp Source 10/09/22 1131 Oral     SpO2 10/09/22 1131 97 %     Weight --      Height --      Head Circumference --      Peak Flow --      Pain Score 10/09/22 1133 10     Pain Loc --      Pain Edu? --      Excl. in GC? --    No data found.  Updated Vital Signs BP 125/77 (BP Location: Right Arm)   Pulse 81   Temp 98.1 F (36.7 C) (Oral)   Resp 16   SpO2 97%   Visual Acuity Right Eye Distance:   Left Eye Distance:   Bilateral Distance:    Right Eye Near:   Left Eye Near:    Bilateral Near:     Physical Exam Vitals reviewed.  Constitutional:      General: He is awake.     Appearance: Normal appearance. He is well-developed. He is not ill-appearing.     Comments: Very pleasant male appears stated age in no acute distress sitting comfortably in exam room  HENT:     Head: Normocephalic and atraumatic.     Mouth/Throat:     Pharynx: Uvula midline. No oropharyngeal exudate or posterior oropharyngeal erythema.  Cardiovascular:     Rate and Rhythm: Normal rate and regular rhythm.     Heart sounds: Normal heart sounds, S1 normal and S2 normal. No murmur heard. Pulmonary:     Effort: Pulmonary effort is normal.     Breath sounds: Normal breath sounds. No stridor. No wheezing, rhonchi or rales.     Comments: Clear to auscultation bilaterally Abdominal:     General: Bowel sounds are normal.     Palpations: Abdomen is soft.     Tenderness: There is no abdominal tenderness. There is no right CVA tenderness, left CVA tenderness, guarding or rebound.  Genitourinary:    Comments: 3 cm x 2 cm ulcerated lesion with purulent drainage and associated induration.  Surrounding erythema without streaking or evidence of lymphangitis.  No fluctuance on exam. Neurological:     Mental Status: He is alert.  Psychiatric:        Behavior: Behavior is cooperative.      UC Treatments / Results  Labs (all labs ordered are  listed, but only abnormal results are displayed) Labs Reviewed  CBC WITH DIFFERENTIAL/PLATELET  COMPREHENSIVE METABOLIC PANEL  HIV ANTIBODY (ROUTINE TESTING W REFLEX)  RPR    EKG   Radiology No results found.  Procedures Procedures (including critical care time)  Medications Ordered in UC Medications - No data to display  Initial Impression / Assessment and Plan / UC Course  I have reviewed the triage vital signs and the nursing notes.  Pertinent labs &  imaging results that were available during my care of the patient were reviewed by me and considered in my medical decision making (see chart for details).     Patient is well-appearing, afebrile, nontoxic, nontachycardic.  No indication for I&D in clinic today as there is no significant fluctuance and lesion is already draining.  Will cover with Augmentin but given recent antibiotics also cover for MRSA with Bactrim DS.  He was instructed to stop the medication and be seen immediately if he develops any rash or oral lesions.  He is to keep area clean with soap and water but recommended that he discontinue alcohol and hydrogen peroxide as this can delay healing.  If this does not heal appropriately with antibiotic use I did recommend they follow-up with the surgeon for further evaluation and management including imaging since we do not have these capabilities in urgent care.  Labs including CBC, CMP, HIV, RPR were obtained.  If he has any abnormal blood count or kidney function will need to go to the emergency room for further evaluation and management.  If he has any worsening or changing symptoms including increasing pain, spread of lesion, fever, nausea, vomiting he should be seen immediately.  Strict return precautions given.  Work excuse note provided.  Final Clinical Impressions(s) / UC Diagnoses   Final diagnoses:  Wound of right buttock, initial encounter  Wound infection     Discharge Instructions      You have an  infected wound.  Please start Augmentin twice daily for 7 days and Bactrim DS twice daily for 7 days.  If you develop any rash or lesions stop the medication to be seen immediately.  Keep this area clean with soap and water.  I do recommend that you stop using alcohol and hydrogen peroxide because at this point it is probably delaying the healing.  If your symptoms or not improving very quickly with antibiotics please follow-up with surgeon for further evaluation and management including potentially imaging.  Call them to schedule an appointment.  I will contact you for lab work is abnormal.  Continue alternating Tylenol ibuprofen over-the-counter for pain relief.  If you have any worsening or changing symptoms including enlarging lesion, fever, increasing pain, difficulty passing a bowel movement, nausea/vomiting you need to go to the emergency room immediately.     ED Prescriptions     Medication Sig Dispense Auth. Provider   amoxicillin-clavulanate (AUGMENTIN) 875-125 MG tablet Take 1 tablet by mouth every 12 (twelve) hours. 14 tablet Kristofor Michalowski K, PA-C   sulfamethoxazole-trimethoprim (BACTRIM DS) 800-160 MG tablet Take 1 tablet by mouth 2 (two) times daily for 7 days. 14 tablet Kiaan Overholser, Noberto Retort, PA-C      PDMP not reviewed this encounter.   Jeani Hawking, PA-C 10/09/22 1204

## 2022-10-10 LAB — RPR
RPR Ser Ql: REACTIVE — AB
RPR Titer: 1:32 {titer}

## 2022-10-14 LAB — T.PALLIDUM AB, TOTAL: T Pallidum Abs: REACTIVE — AB

## 2022-10-15 ENCOUNTER — Telehealth (HOSPITAL_COMMUNITY): Payer: Self-pay | Admitting: Emergency Medicine

## 2022-10-15 NOTE — Telephone Encounter (Signed)
Patient will need treatment with 2.4 million units of IM Bicillin for positive Syphilis. Attempted to reach patient x 2, mother states she will relay the message

## 2022-12-30 ENCOUNTER — Ambulatory Visit (HOSPITAL_COMMUNITY)
Admission: EM | Admit: 2022-12-30 | Discharge: 2022-12-30 | Disposition: A | Discharge status: Home / Self Care | Payer: Self-pay | Attending: Internal Medicine | Admitting: Internal Medicine

## 2022-12-30 ENCOUNTER — Encounter (HOSPITAL_COMMUNITY): Payer: Self-pay | Admitting: *Deleted

## 2022-12-30 ENCOUNTER — Other Ambulatory Visit: Payer: Self-pay

## 2022-12-30 DIAGNOSIS — J340 Abscess, furuncle and carbuncle of nose: Secondary | ICD-10-CM

## 2022-12-30 MED ORDER — SULFAMETHOXAZOLE-TRIMETHOPRIM 800-160 MG PO TABS: 1.0000 | ORAL_TABLET | Freq: Two times a day (BID) | ORAL | 0 refills | Status: AC

## 2022-12-30 MED ORDER — IBUPROFEN 800 MG PO TABS: 800.0000 mg | ORAL_TABLET | Freq: Three times a day (TID) | ORAL | 0 refills | Status: DC | PRN

## 2022-12-30 NOTE — Discharge Instructions (Addendum)
Please take antibiotics as directed Warm compress as needed If you have persistent or worsening swelling, purulent nasal discharge, persistent headaches, double vision or confusion please go to the emergency room to be evaluated further.

## 2022-12-30 NOTE — ED Triage Notes (Signed)
Pt reports he had a bump on the inside of his nose fo one week. Pt presents today with swelling to nose and face.

## 2022-12-30 NOTE — ED Provider Notes (Signed)
[facility]    CSN: [id number] Arrival date & time: 12/30/22  1043      History   Chief Complaint Chief Complaint  Patient presents with   Facial Swelling    HPI Mario Jordan is a 37 y.o. male was to the urgent care with painful swelling of the nose which started a week ago.  Patient describes the pain as throbbing and of moderate severity.  Pain is aggravated by touching the nose.  Patient denies any relieving factors.  No fever or chills.  Patient has some purulent drainage from the nose.  No trauma to the nose.Marland Kitchen   HPI  Past Medical History:  Diagnosis Date   Asthma 05/21/2017   Marijuana use 05/21/2017   Nicotine dependence 05/21/2017   Polysubstance abuse (HCC) 05/21/2017    Patient Active Problem List   Diagnosis Date Noted   Polysubstance abuse (HCC) 05/21/2017   Marijuana use 05/21/2017   Nicotine dependence 05/21/2017   Asthma 05/21/2017   Calcaneus fracture, right, tongue type fracture 05/20/2017    Past Surgical History:  Procedure Laterality Date   ORIF CALCANEOUS FRACTURE Right 05/20/2017   Procedure: OPEN REDUCTION INTERNAL FIXATION (ORIF) CALCANEOUS FRACTURE;  Surgeon: Myrene Galas, MD;  Location: [facility];  Service: Orthopedics;  Laterality: Right;       Home Medications    Prior to Admission medications   Medication Sig Start Date End Date Taking? Authorizing Provider  ibuprofen (ADVIL) 800 MG tablet Take 1 tablet (800 mg total) by mouth every 8 (eight) hours as needed. 12/30/22  Yes Cythia Bachtel, Britta Mccreedy, MD  sulfamethoxazole-trimethoprim (BACTRIM DS) 800-160 MG tablet Take 1 tablet by mouth 2 (two) times daily for 7 days. 12/30/22 01/06/23 Yes Abhijay Morriss, Britta Mccreedy, MD  albuterol (VENTOLIN HFA) 108 (90 Base) MCG/ACT inhaler Inhale 2 puffs into the lungs every 6 (six) hours as needed for wheezing or shortness of breath. 04/21/22   Debby Freiberg, NP  Aspirin-Acetaminophen-Caffeine (EXCEDRIN EXTRA STRENGTH PO) Take 4 tablets by mouth  once as needed (pain).    [provider]    Family History Family History  Problem Relation Age of Onset   Healthy Mother    Healthy Father     Social History Social History   Tobacco Use   Smoking status: Some Days    Types: Cigarettes   Smokeless tobacco: Never  Vaping Use   Vaping status: Never Used  Substance Use Topics   Alcohol use: Yes    Comment: occasion   Drug use: Not Currently    Types: Cocaine, Methamphetamines     Allergies   Patient has no known allergies.   Review of Systems Review of Systems As per HPI  Physical Exam Triage Vital Signs ED Triage Vitals  Encounter Vitals Group     BP 12/30/22 1054 124/86     Systolic BP Percentile --      Diastolic BP Percentile --      Pulse Rate 12/30/22 1054 90     Resp 12/30/22 1054 18     Temp 12/30/22 1054 98.8 F (37.1 C)     Temp src --      SpO2 12/30/22 1054 98 %     Weight --      Height --      Head Circumference --      Peak Flow --      Pain Score 12/30/22 1052 10     Pain Loc --      Pain  Education --      Exclude from [employer] Chart --    No data found.  Updated Vital Signs BP 124/86   Pulse 90   Temp 98.8 F (37.1 C)   Resp 18   SpO2 98%   Visual Acuity Right Eye Distance:   Left Eye Distance:   Bilateral Distance:    Right Eye Near:   Left Eye Near:    Bilateral Near:     Physical Exam Vitals and nursing note reviewed.  Constitutional:      General: He is not in acute distress.    Appearance: He is not ill-appearing.  HENT:     Nose:     Comments: Indurated swelling of the right external nose.  Mild erythema involving the right nose. Cardiovascular:     Rate and Rhythm: Normal rate and regular rhythm.     Pulses: Normal pulses.     Heart sounds: Normal heart sounds.  Neurological:     Mental Status: He is alert.      UC Treatments / Results  Labs (all labs ordered are listed, but only abnormal results are displayed) Labs Reviewed - No data to  display  EKG   Radiology No results found.  Procedures Procedures (including critical care time)  Medications Ordered in UC Medications - No data to display  Initial Impression / Assessment and Plan / UC Course  I have reviewed the triage vital signs and the nursing notes.  Pertinent labs & imaging results that were available during my care of the patient were reviewed by me and considered in my medical decision making (see chart for details).     1.  Cellulitis with abscess of the external nose: Bactrim double strength 1 tablet twice daily Ibuprofen as needed for pain Warm compress as needed Return precautions given. Final Clinical Impressions(s) / UC Diagnoses   Final diagnoses:  Abscess of external nose     Discharge Instructions      Please take antibiotics as directed Warm compress as needed If you have persistent or worsening swelling, purulent nasal discharge, persistent headaches, double vision or confusion please go to the emergency room to be evaluated further.   ED Prescriptions     Medication Sig Dispense Auth. Provider   sulfamethoxazole-trimethoprim (BACTRIM DS) 800-160 MG tablet Take 1 tablet by mouth 2 (two) times daily for 7 days. 14 tablet Soliana Kitko, Britta Mccreedy, MD   ibuprofen (ADVIL) 800 MG tablet Take 1 tablet (800 mg total) by mouth every 8 (eight) hours as needed. 21 tablet Jonathyn Carothers, Britta Mccreedy, MD      PDMP not reviewed this encounter.   Merrilee Jansky, MD 12/30/22 [phone]

## 2023-01-01 ENCOUNTER — Other Ambulatory Visit: Payer: Self-pay

## 2023-01-01 ENCOUNTER — Encounter (HOSPITAL_COMMUNITY): Payer: Self-pay

## 2023-01-01 ENCOUNTER — Emergency Department (HOSPITAL_COMMUNITY)
Admission: EM | Admit: 2023-01-01 | Discharge: 2023-01-01 | Disposition: A | Discharge status: Home / Self Care | Payer: Self-pay | Attending: Emergency Medicine | Admitting: Emergency Medicine

## 2023-01-01 ENCOUNTER — Emergency Department (HOSPITAL_COMMUNITY): Payer: Self-pay

## 2023-01-01 DIAGNOSIS — J340 Abscess, furuncle and carbuncle of nose: Secondary | ICD-10-CM

## 2023-01-01 DIAGNOSIS — L03211 Cellulitis of face: Secondary | ICD-10-CM | POA: Insufficient documentation

## 2023-01-01 DIAGNOSIS — Z7982 Long term (current) use of aspirin: Secondary | ICD-10-CM | POA: Insufficient documentation

## 2023-01-01 DIAGNOSIS — L0291 Cutaneous abscess, unspecified: Secondary | ICD-10-CM

## 2023-01-01 DIAGNOSIS — L0201 Cutaneous abscess of face: Secondary | ICD-10-CM | POA: Insufficient documentation

## 2023-01-01 LAB — CBC WITH DIFFERENTIAL/PLATELET
Abs Immature Granulocytes: 0.03 10*3/uL (ref 0.00–0.07)
Basophils Absolute: 0 10*3/uL (ref 0.0–0.1)
Basophils Relative: 1 %
Eosinophils Absolute: 0.1 10*3/uL (ref 0.0–0.5)
Eosinophils Relative: 1 %
HCT: 42.5 % (ref 39.0–52.0)
Hemoglobin: 13.9 g/dL (ref 13.0–17.0)
Immature Granulocytes: 0 %
Lymphocytes Relative: 13 %
Lymphs Abs: 1 10*3/uL (ref 0.7–4.0)
MCH: 31.6 pg (ref 26.0–34.0)
MCHC: 32.7 g/dL (ref 30.0–36.0)
MCV: 96.6 fL (ref 80.0–100.0)
Monocytes Absolute: 0.7 10*3/uL (ref 0.1–1.0)
Monocytes Relative: 9 %
Neutro Abs: 5.9 10*3/uL (ref 1.7–7.7)
Neutrophils Relative %: 76 %
Platelets: 302 10*3/uL (ref 150–400)
RBC: 4.4 MIL/uL (ref 4.22–5.81)
RDW: 13.4 % (ref 11.5–15.5)
WBC: 7.8 10*3/uL (ref 4.0–10.5)
nRBC: 0 % (ref 0.0–0.2)

## 2023-01-01 LAB — BASIC METABOLIC PANEL
Anion gap: 15 (ref 5–15)
BUN: 12 mg/dL (ref 6–20)
CO2: 24 mmol/L (ref 22–32)
Calcium: 9.2 mg/dL (ref 8.9–10.3)
Chloride: 96 mmol/L — ABNORMAL LOW (ref 98–111)
Creatinine, Ser: 0.97 mg/dL (ref 0.61–1.24)
GFR, Estimated: >60 mL/min (ref >60)
Glucose, Bld: 88 mg/dL (ref 70–99)
Potassium: 3.8 mmol/L (ref 3.5–5.1)
Sodium: 135 mmol/L (ref 135–145)

## 2023-01-01 MED ORDER — DOXYCYCLINE HYCLATE 100 MG PO CAPS: 100.0000 mg | ORAL_CAPSULE | Freq: Two times a day (BID) | ORAL | 0 refills | Status: AC

## 2023-01-01 MED ORDER — SODIUM CHLORIDE 0.9 % IV SOLN
1.0000 g | Freq: Once | INTRAVENOUS | Status: AC
  Administered 2023-01-01: 1 g via INTRAVENOUS
  Filled 2023-01-01: qty 10

## 2023-01-01 MED ORDER — IBUPROFEN 400 MG PO TABS
600.0000 mg | ORAL_TABLET | Freq: Once | ORAL | Status: AC
  Administered 2023-01-01: 600 mg via ORAL
  Filled 2023-01-01: qty 1

## 2023-01-01 MED ORDER — OXYCODONE-ACETAMINOPHEN 5-325 MG PO TABS
1.0000 | ORAL_TABLET | Freq: Once | ORAL | Status: AC
  Administered 2023-01-01: 1 via ORAL
  Filled 2023-01-01: qty 1

## 2023-01-01 MED ORDER — OXYCODONE-ACETAMINOPHEN 5-325 MG PO TABS: 1.0000 | ORAL_TABLET | Freq: Three times a day (TID) | ORAL | 0 refills | Status: DC | PRN

## 2023-01-01 MED ORDER — IOHEXOL 350 MG/ML SOLN
75.0000 mL | Freq: Once | INTRAVENOUS | Status: AC | PRN
  Administered 2023-01-01: 75 mL via INTRAVENOUS

## 2023-01-01 MED ORDER — LIDOCAINE-EPINEPHRINE-TETRACAINE (LET) TOPICAL GEL
3.0000 mL | Freq: Once | TOPICAL | Status: AC
  Administered 2023-01-01: 3 mL via TOPICAL
  Filled 2023-01-01: qty 3

## 2023-01-01 NOTE — ED Notes (Signed)
Patient transported to CT 

## 2023-01-01 NOTE — ED Notes (Signed)
Pt verbalized understanding of discharge instructions. Opportunity for questions provided.  

## 2023-01-01 NOTE — Discharge Instructions (Addendum)
Apply warm compresses (clean towels soaked in hot water) to your nose, gently massaging the left side, 4 times per day for the next 2 days, to try to get some of the remaining pus to drain out.  Start taking the doxycycline antibiotic in ADDITION to the bactrim prescribed at urgent care.  If your nose swelling is getting worse, or you begin having fevers, or pain in your eyes, or blurred vision, or severe headaches, please return to the ER.  These may be signs of a worsening infection.

## 2023-01-01 NOTE — ED Provider Triage Note (Signed)
Emergency Medicine Provider Triage Evaluation Note  Mario Jordan , a 37 y.o. male  was evaluated in triage.  Pt complains of abscess/swelling of nose  Symptom onset about 10 days ago Hx of 'abscess' on lower body, never face Thinks he was picking as skin on nose  Seen at [facility] 12/30/22 started on bactrim, has been taking for 2 days with no improvement  Review of Systems  Positive: Nose pain, redness Negative: Fevers  Physical Exam  BP (!) 142/86 (BP Location: Right Arm)   Pulse 70   Temp 97.7 F (36.5 C) (Oral)   Resp 16   Ht 5\' 5"  (1.651 m)   Wt 63.5 kg   SpO2 100%   BMI 23.30 kg/m  Gen:   Awake, no distress   Resp:  Normal effort  MSK:   Moves extremities without difficulty  Other:  Swelling and erythema of the entire nose, small tender nodule external to left nares  Medical Decision Making  Medically screening exam initiated at 9:19 AM.  Appropriate orders placed.  Gaynelle Arabian was informed that the remainder of the evaluation will be completed by another provider, this initial triage assessment does not replace that evaluation, and the importance of remaining in the ED until their evaluation is complete.  Concern for nasal infection involving diffuse nose No large discernable abscess on exam    Terald Sleeper, MD 01/01/23 [phone]

## 2023-01-01 NOTE — ED Triage Notes (Signed)
Pt c/o abscess on inside on left side of nosex1wk. Pt has 2+ swelling to nose and it's erythematous.

## 2023-01-01 NOTE — ED Provider Notes (Signed)
Mario Jordan Provider Note   CSN: [id number] Arrival date & time: 01/01/23  1324     History  Chief Complaint  Patient presents with   Abscess    Mario Jordan is a 37 y.o. male for pain and swelling of the nose ongoing for about 9 to 10 days.  Seen in urgent care approximately 2 days ago and started on Bactrim with concern for nasal cellulitis.  He reports no improvement of his symptoms and continued redness and pain.  He reports he may have been picking at his nose to proceed this.  Reports a history of abscesses in the lower extremity, and saying "they run in the family".  HPI     Home Medications Prior to Admission medications   Medication Sig Start Date End Date Taking? Authorizing Provider  doxycycline (VIBRAMYCIN) 100 MG capsule Take 1 capsule (100 mg total) by mouth 2 (two) times daily for 7 days. 01/01/23 01/08/23 Yes Terrick Allred, Kermit Balo, MD  oxyCODONE-acetaminophen (PERCOCET/ROXICET) 5-325 MG tablet Take 1 tablet by mouth every 8 (eight) hours as needed for up to 5 doses for severe pain. 01/01/23  Yes Donelle Hise, Kermit Balo, MD  albuterol (VENTOLIN HFA) 108 (90 Base) MCG/ACT inhaler Inhale 2 puffs into the lungs every 6 (six) hours as needed for wheezing or shortness of breath. 04/21/22   Debby Freiberg, NP  Aspirin-Acetaminophen-Caffeine (EXCEDRIN EXTRA STRENGTH PO) Take 4 tablets by mouth once as needed (pain).    [provider]  ibuprofen (ADVIL) 800 MG tablet Take 1 tablet (800 mg total) by mouth every 8 (eight) hours as needed. 12/30/22   Lamptey, Britta Mccreedy, MD  sulfamethoxazole-trimethoprim (BACTRIM DS) 800-160 MG tablet Take 1 tablet by mouth 2 (two) times daily for 7 days. 12/30/22 01/06/23  Merrilee Jansky, MD      Allergies    Patient has no known allergies.    Review of Systems   Review of Systems  Physical Exam Updated Vital Signs BP 131/85   Pulse (!) 56   Temp 98 F (36.7 C) (Oral)   Resp 16   Ht  5\' 5"  (1.651 m)   Wt 63.5 kg   SpO2 100%   BMI 23.30 kg/m  Physical Exam Constitutional:      General: He is not in acute distress. HENT:     Head: Normocephalic and atraumatic.     Comments: Diffuse nasal erythema, swelling, skin breakdown on the anterior nose, small nodule above the left nares that is tender, no large fluctuant mass Eyes:     Conjunctiva/sclera: Conjunctivae normal.     Pupils: Pupils are equal, round, and reactive to light.  Cardiovascular:     Rate and Rhythm: Normal rate and regular rhythm.  Pulmonary:     Effort: Pulmonary effort is normal. No respiratory distress.  Abdominal:     General: There is no distension.     Tenderness: There is no abdominal tenderness.  Skin:    General: Skin is warm and dry.  Neurological:     General: No focal deficit present.     Mental Status: He is alert. Mental status is at baseline.  Psychiatric:        Mood and Affect: Mood normal.        Behavior: Behavior normal.     ED Results / Procedures / Treatments   Labs (all labs ordered are listed, but only abnormal results are displayed) Labs Reviewed  BASIC METABOLIC PANEL -  Abnormal; Notable for the following components:      Result Value   Chloride 96 (*)    All other components within normal limits  CBC WITH DIFFERENTIAL/PLATELET    EKG None  Radiology CT Maxillofacial W Contrast  Result Date: 01/01/2023 CLINICAL DATA:  Nasal abscess Concern for nasal cellulitis, possible abscess, diffuse involvement of nose EXAM: CT MAXILLOFACIAL WITH CONTRAST TECHNIQUE: Multidetector CT imaging of the maxillofacial structures was performed with intravenous contrast. Multiplanar CT image reconstructions were also generated. RADIATION DOSE REDUCTION: This exam was performed according to the departmental dose-optimization program which includes automated exposure control, adjustment of the mA and/or kV according to patient size and/or use of iterative reconstruction technique.  CONTRAST:  75mL OMNIPAQUE IOHEXOL 350 MG/ML SOLN COMPARISON:  CT maxillofacial June 26, 23. FINDINGS: Osseous: No fracture or mandibular dislocation. No destructive process. Orbits: Negative. No traumatic or inflammatory finding. Sinuses: Clear sinuses. Soft tissues: Soft tissue edema/cellulitis/phlegmon involving the left nasal ala with inferomedial 6 x 9 x 5 mm fluid collection compatible with abscess. Limited intracranial: No significant or unexpected finding. IMPRESSION: Soft tissue edema/cellulitis/phlegmon involving the left nasal ala with inferomedial 6 x 9 x 5 mm fluid collection compatible with abscess. Electronically Signed   By: Feliberto Harts M.D.   On: 01/01/2023 13:23    Procedures Procedures    Medications Ordered in ED Medications  oxyCODONE-acetaminophen (PERCOCET/ROXICET) 5-325 MG per tablet 1 tablet (1 tablet Oral Given 01/01/23 0930)  cefTRIAXone (ROCEPHIN) 1 g in sodium chloride 0.9 % 100 mL IVPB (0 g Intravenous Stopped 01/01/23 1234)  iohexol (OMNIPAQUE) 350 MG/ML injection 75 mL (75 mLs Intravenous Contrast Given 01/01/23 1240)  lidocaine-EPINEPHrine-tetracaine (LET) topical gel (3 mLs Topical Given by Other 01/01/23 1413)  oxyCODONE-acetaminophen (PERCOCET/ROXICET) 5-325 MG per tablet 1 tablet (1 tablet Oral Given 01/01/23 1423)  ibuprofen (ADVIL) tablet 600 mg (600 mg Oral Given 01/01/23 1423)    ED Course/ Medical Decision Making/ A&P                                 Medical Decision Making Amount and/or Complexity of Data Reviewed Labs: ordered. Radiology: ordered.  Risk Prescription drug management.   Patient is here with concern for nasal cellulitis potential abscess.  Given the degree of inflammation involving the nose I do think CT imaging is reasonable to evaluate for deep tracking infection.  I do not see evidence otherwise of cavernous sinus thrombosis, no significant headache or blurred vision.  No evidence of orbital cellulitis.  Vision is intact.  I  personally reviewed the patient's labs and imaging, and there is no leukocytosis.  CT scan notable for small 1 cm abscess near the left nasal ala  Patient was given Rocephin here in the ED.  We were able to needle aspirate a small amount of purulent fluid out of this small abscess.  There is also some pus spontaneously draining near the left nasal ala. I opted not to perform incision and drainage given concern for disfigurement and scarring and highly vascular region of the nose.  I thought this collection was small enough to be amenable for fluid aspiration than antibiotics.  Patient will be started on doxycycline at home to ensure coverage for potential MRSA if bactrim alone is not sufficient.  Now with nearly 2 days of treatment, it is possible that Bactrim alone is not sufficient, although it is too early to determine this for certainty.  I  do not see indication for hospitalization at this time.  If he fails to improve or symptoms are worsening he will need to return to the ER.  He verbalized understanding        Final Clinical Impression(s) / ED Diagnoses Final diagnoses:  Abscess  Cellulitis of nasal tip    Rx / DC Orders ED Discharge Orders          Ordered    doxycycline (VIBRAMYCIN) 100 MG capsule  2 times daily        01/01/23 1454    oxyCODONE-acetaminophen (PERCOCET/ROXICET) 5-325 MG tablet  Every 8 hours PRN        01/01/23 1454              Terald Sleeper, MD 01/01/23 1454

## 2023-02-04 ENCOUNTER — Ambulatory Visit (HOSPITAL_COMMUNITY)
Admission: EM | Admit: 2023-02-04 | Discharge: 2023-02-04 | Disposition: A | Discharge status: Home / Self Care | Payer: Self-pay | Attending: Physician Assistant | Admitting: Physician Assistant

## 2023-02-04 ENCOUNTER — Other Ambulatory Visit: Payer: Self-pay

## 2023-02-04 ENCOUNTER — Encounter (HOSPITAL_COMMUNITY): Payer: Self-pay | Admitting: Emergency Medicine

## 2023-02-04 DIAGNOSIS — J45901 Unspecified asthma with (acute) exacerbation: Secondary | ICD-10-CM

## 2023-02-04 MED ORDER — ALBUTEROL SULFATE HFA 108 (90 BASE) MCG/ACT IN AERS: 2.0000 | INHALATION_SPRAY | Freq: Four times a day (QID) | RESPIRATORY_TRACT | 0 refills | Status: DC | PRN

## 2023-02-04 MED ORDER — PREDNISONE 20 MG PO TABS: 40.0000 mg | ORAL_TABLET | Freq: Every day | ORAL | 0 refills | Status: AC

## 2023-02-04 NOTE — ED Notes (Signed)
Patient asking for work note.

## 2023-02-04 NOTE — ED Triage Notes (Addendum)
Reports asthma exacerbation started yesterday.  Patient reports sob.  Reports albuterol inhaler ran out last month.  Patient is interacting on phone:texting and engaged in a phone call during intake.  Patient answers in complete sentences.

## 2023-02-04 NOTE — ED Provider Notes (Signed)
[facility]    CSN: [id number] Arrival date & time: 02/04/23  1100      History   Chief Complaint Chief Complaint  Patient presents with   Asthma    HPI Mario Jordan is a 37 y.o. male.   Patient here today for evaluation of possible asthma exacerbation.  He reports that he has had increased shortness of breath and wheezing over the last few days.  He denies any fever.  He has not had any cough or congestion.  He denies any vomiting or diarrhea.  He does not report treatment for symptoms and states he ran out of his albuterol inhaler about a month ago.  He declines screening for COVID.  The history is provided by the patient.    Past Medical History:  Diagnosis Date   Asthma 05/21/2017   Marijuana use 05/21/2017   Nicotine dependence 05/21/2017   Polysubstance abuse (HCC) 05/21/2017    Patient Active Problem List   Diagnosis Date Noted   Polysubstance abuse (HCC) 05/21/2017   Marijuana use 05/21/2017   Nicotine dependence 05/21/2017   Asthma 05/21/2017   Calcaneus fracture, right, tongue type fracture 05/20/2017    Past Surgical History:  Procedure Laterality Date   ORIF CALCANEOUS FRACTURE Right 05/20/2017   Procedure: OPEN REDUCTION INTERNAL FIXATION (ORIF) CALCANEOUS FRACTURE;  Surgeon: Myrene Galas, MD;  Location: [facility];  Service: Orthopedics;  Laterality: Right;       Home Medications    Prior to Admission medications   Medication Sig Start Date End Date Taking? Authorizing Provider  predniSONE (DELTASONE) 20 MG tablet Take 2 tablets (40 mg total) by mouth daily with breakfast for 5 days. 02/04/23 02/09/23 Yes Tomi Bamberger, PA-C  albuterol (VENTOLIN HFA) 108 (90 Base) MCG/ACT inhaler Inhale 2 puffs into the lungs every 6 (six) hours as needed for wheezing or shortness of breath. 02/04/23   Tomi Bamberger, PA-C    Family History Family History  Problem Relation Age of Onset   Healthy Mother    Healthy Father     Social  History Social History   Tobacco Use   Smoking status: Some Days    Types: Cigarettes   Smokeless tobacco: Never  Vaping Use   Vaping status: Never Used  Substance Use Topics   Alcohol use: Yes    Comment: occasion   Drug use: Not Currently    Types: Cocaine, Methamphetamines     Allergies   Patient has no known allergies.   Review of Systems Review of Systems  Constitutional:  Negative for chills and fever.  HENT:  Negative for congestion and sore throat.   Eyes:  Negative for discharge and redness.  Respiratory:  Positive for shortness of breath and wheezing. Negative for cough.   Gastrointestinal:  Negative for diarrhea, nausea and vomiting.  Neurological:  Negative for numbness.     Physical Exam Triage Vital Signs ED Triage Vitals  Encounter Vitals Group     BP      Systolic BP Percentile      Diastolic BP Percentile      Pulse      Resp      Temp      Temp src      SpO2      Weight      Height      Head Circumference      Peak Flow      Pain Score      Pain Loc  Pain Education      Exclude from Growth Chart    No data found.  Updated Vital Signs BP 121/88 (BP Location: Right Arm)   Pulse 91   Temp 98.2 F (36.8 C) (Oral)   Resp 20   SpO2 97%     Physical Exam Vitals and nursing note reviewed.  Constitutional:      General: He is not in acute distress.    Appearance: Normal appearance. He is not ill-appearing.  HENT:     Head: Normocephalic and atraumatic.     Nose: Nose normal. No congestion or rhinorrhea.  Eyes:     Conjunctiva/sclera: Conjunctivae normal.  Cardiovascular:     Rate and Rhythm: Normal rate and regular rhythm.  Pulmonary:     Effort: Pulmonary effort is normal. No respiratory distress.     Breath sounds: No wheezing, rhonchi or rales.     Comments: Breath sounds somewhat diminished throughout Neurological:     Mental Status: He is alert.  Psychiatric:        Mood and Affect: Mood normal.        Behavior:  Behavior normal.        Thought Content: Thought content normal.      UC Treatments / Results  Labs (all labs ordered are listed, but only abnormal results are displayed) Labs Reviewed - No data to display  EKG   Radiology No results found.  Procedures Procedures (including critical care time)  Medications Ordered in UC Medications - No data to display  Initial Impression / Assessment and Plan / UC Course  I have reviewed the triage vital signs and the nursing notes.  Pertinent labs & imaging results that were available during my care of the patient were reviewed by me and considered in my medical decision making (see chart for details).    Steroid burst prescribed to cover suspected asthma exacerbation and will refill albuterol inhaler.  Recommended follow-up if no gradual improvement or with any further concerns.  Patient expressed understanding.  Final Clinical Impressions(s) / UC Diagnoses   Final diagnoses:  Exacerbation of asthma, unspecified asthma severity, unspecified whether persistent   Discharge Instructions   None    ED Prescriptions     Medication Sig Dispense Auth. Provider   albuterol (VENTOLIN HFA) 108 (90 Base) MCG/ACT inhaler Inhale 2 puffs into the lungs every 6 (six) hours as needed for wheezing or shortness of breath. 8 g Erma Pinto F, PA-C   predniSONE (DELTASONE) 20 MG tablet Take 2 tablets (40 mg total) by mouth daily with breakfast for 5 days. 10 tablet Tomi Bamberger, PA-C      PDMP not reviewed this encounter.   Tomi Bamberger, PA-C 02/04/23 1244

## 2023-02-21 ENCOUNTER — Ambulatory Visit (HOSPITAL_COMMUNITY): Payer: Self-pay

## 2023-02-21 ENCOUNTER — Encounter (HOSPITAL_COMMUNITY): Payer: Self-pay | Admitting: Emergency Medicine

## 2023-02-21 ENCOUNTER — Ambulatory Visit (HOSPITAL_COMMUNITY)
Admission: EM | Admit: 2023-02-21 | Discharge: 2023-02-21 | Disposition: A | Discharge status: Home / Self Care | Payer: Self-pay | Attending: Family Medicine | Admitting: Family Medicine

## 2023-02-21 ENCOUNTER — Ambulatory Visit (INDEPENDENT_AMBULATORY_CARE_PROVIDER_SITE_OTHER): Payer: Self-pay

## 2023-02-21 DIAGNOSIS — S92354A Nondisplaced fracture of fifth metatarsal bone, right foot, initial encounter for closed fracture: Secondary | ICD-10-CM

## 2023-02-21 NOTE — ED Triage Notes (Addendum)
Had surgery on ankle in 2018 fracture. On Wednesday going down down stairs and twisted right ankle. Ankle is swollen and painful. Pt has been elevating and icing ankle.  Took Tylenol last night

## 2023-02-21 NOTE — Discharge Instructions (Addendum)
You were diagnosed with a fracture of the 5th metatarsal.    If the radiologist sees something else we will call to notify you.  I have given you a post-op shoe to wear.  I recommend you use tylenol/motrin for pain.  Please call [facility] for an appointment at [phone] for further care.

## 2023-02-21 NOTE — ED Provider Notes (Signed)
[facility]    CSN: [id number] Arrival date & time: 02/21/23  0827      History   Chief Complaint Chief Complaint  Patient presents with   Ankle Pain    HPI Mario Jordan is a 37 y.o. male.    Ankle Pain  Patient is here for foot pain.  He had surgery at his right ankle in 2018.  Yesterday he was going down some stairs, and fell.  He had some pain at the right foot.  After resting he hand more pain and swelling.  Worse today as well.  Difficulty bearing weight.         Past Medical History:  Diagnosis Date   Asthma 05/21/2017   Marijuana use 05/21/2017   Nicotine dependence 05/21/2017   Polysubstance abuse (HCC) 05/21/2017    Patient Active Problem List   Diagnosis Date Noted   Polysubstance abuse (HCC) 05/21/2017   Marijuana use 05/21/2017   Nicotine dependence 05/21/2017   Asthma 05/21/2017   Calcaneus fracture, right, tongue type fracture 05/20/2017    Past Surgical History:  Procedure Laterality Date   ORIF CALCANEOUS FRACTURE Right 05/20/2017   Procedure: OPEN REDUCTION INTERNAL FIXATION (ORIF) CALCANEOUS FRACTURE;  Surgeon: Myrene Galas, MD;  Location: [facility];  Service: Orthopedics;  Laterality: Right;       Home Medications    Prior to Admission medications   Medication Sig Start Date End Date Taking? Authorizing Provider  albuterol (VENTOLIN HFA) 108 (90 Base) MCG/ACT inhaler Inhale 2 puffs into the lungs every 6 (six) hours as needed for wheezing or shortness of breath. 02/04/23   Tomi Bamberger, PA-C    Family History Family History  Problem Relation Age of Onset   Healthy Mother    Healthy Father     Social History Social History   Tobacco Use   Smoking status: Some Days    Types: Cigarettes   Smokeless tobacco: Never  Vaping Use   Vaping status: Never Used  Substance Use Topics   Alcohol use: Yes    Comment: occasion   Drug use: Not Currently    Types: Cocaine, Methamphetamines     Allergies    Patient has no known allergies.   Review of Systems Review of Systems  Constitutional: Negative.   HENT: Negative.    Cardiovascular: Negative.   Gastrointestinal: Negative.   Musculoskeletal:  Positive for gait problem.     Physical Exam Triage Vital Signs ED Triage Vitals  Encounter Vitals Group     BP 02/21/23 0850 (!) 139/92     Systolic BP Percentile --      Diastolic BP Percentile --      Pulse Rate 02/21/23 0850 71     Resp 02/21/23 0850 16     Temp 02/21/23 0850 99 F (37.2 C)     Temp Source 02/21/23 0850 Oral     SpO2 02/21/23 0850 97 %     Weight --      Height --      Head Circumference --      Peak Flow --      Pain Score 02/21/23 0849 10     Pain Loc --      Pain Education --      Exclude from Growth Chart --    No data found.  Updated Vital Signs BP (!) 139/92 (BP Location: Right Arm)   Pulse 71   Temp 99 F (37.2 C) (Oral)   Resp 16  SpO2 97%   Visual Acuity Right Eye Distance:   Left Eye Distance:   Bilateral Distance:    Right Eye Near:   Left Eye Near:    Bilateral Near:     Physical Exam Constitutional:      Appearance: Normal appearance.  Musculoskeletal:     Comments: The right lateral foot is swollen.  No TTP to the right ankle joint;  full rom at the ankle.  He has TTP to the 3rd-5th metatarsals  Neurological:     General: No focal deficit present.     Mental Status: He is alert.  Psychiatric:        Mood and Affect: Mood normal.      UC Treatments / Results  Labs (all labs ordered are listed, but only abnormal results are displayed) Labs Reviewed - No data to display  EKG   Radiology No results found.  Procedures Procedures (including critical care time)  Medications Ordered in UC Medications - No data to display  Initial Impression / Assessment and Plan / UC Course  I have reviewed the triage vital signs and the nursing notes.  Pertinent labs & imaging results that were available during my care of  the patient were reviewed by me and considered in my medical decision making (see chart for details).   Final Clinical Impressions(s) / UC Diagnoses   Final diagnoses:  Closed nondisplaced fracture of fifth metatarsal bone of right foot, initial encounter     Discharge Instructions      You were diagnosed with a fracture of the 5th metatarsal.    I have given you a post-op shoe to wear.  I recommend you use tylenol/motrin for pain.  Please call [facility] for an appointment at [phone] for further care.     ED Prescriptions   None    PDMP not reviewed this encounter.   Jannifer Franklin, MD 02/21/23 [phone]

## 2023-04-15 ENCOUNTER — Ambulatory Visit (HOSPITAL_COMMUNITY)
Admission: EM | Admit: 2023-04-15 | Discharge: 2023-04-15 | Disposition: A | Discharge status: Home / Self Care | Payer: Self-pay

## 2023-04-15 NOTE — ED Notes (Signed)
Called patient x 3. No answer from waiting room.

## 2023-09-05 ENCOUNTER — Other Ambulatory Visit: Payer: Self-pay

## 2023-09-05 ENCOUNTER — Encounter (HOSPITAL_COMMUNITY): Payer: Self-pay

## 2023-09-05 ENCOUNTER — Emergency Department (HOSPITAL_COMMUNITY)
Admission: EM | Admit: 2023-09-05 | Discharge: 2023-09-05 | Disposition: A | Discharge status: Home / Self Care | Payer: Self-pay | Attending: Emergency Medicine | Admitting: Emergency Medicine

## 2023-09-05 DIAGNOSIS — J020 Streptococcal pharyngitis: Secondary | ICD-10-CM | POA: Insufficient documentation

## 2023-09-05 DIAGNOSIS — J45909 Unspecified asthma, uncomplicated: Secondary | ICD-10-CM | POA: Insufficient documentation

## 2023-09-05 LAB — RESP PANEL BY RT-PCR (RSV, FLU A&B, COVID)  RVPGX2
Influenza A by PCR: NEGATIVE
Influenza B by PCR: NEGATIVE
Resp Syncytial Virus by PCR: NEGATIVE
SARS Coronavirus 2 by RT PCR: NEGATIVE

## 2023-09-05 LAB — GROUP A STREP BY PCR: Group A Strep by PCR: DETECTED — AB

## 2023-09-05 MED ORDER — PENICILLIN G BENZATHINE 1200000 UNIT/2ML IM SUSY
1.2000 10*6.[IU] | PREFILLED_SYRINGE | Freq: Once | INTRAMUSCULAR | Status: AC
  Administered 2023-09-05: 1.2 10*6.[IU] via INTRAMUSCULAR
  Filled 2023-09-05: qty 2

## 2023-09-05 MED ORDER — ACETAMINOPHEN 500 MG PO TABS
1000.0000 mg | ORAL_TABLET | Freq: Once | ORAL | Status: AC
  Administered 2023-09-05: 1000 mg via ORAL
  Filled 2023-09-05: qty 2

## 2023-09-05 MED ORDER — DEXAMETHASONE SODIUM PHOSPHATE 10 MG/ML IJ SOLN
8.0000 mg | Freq: Once | INTRAMUSCULAR | Status: AC
  Administered 2023-09-05: 8 mg via INTRAMUSCULAR
  Filled 2023-09-05: qty 1

## 2023-09-05 NOTE — Discharge Instructions (Addendum)

## 2023-09-05 NOTE — ED Provider Notes (Signed)
 [place] EMERGENCY DEPARTMENT AT [facility] Provider Note   CSN: [id number] Arrival date & time: 09/05/23  4098     History  Chief Complaint  Patient presents with   Sore Throat    Mario Jordan is a 38 y.o. male with PMHx polysubstance use disorder, asthma who presents to ED concerned for sore throat and fevers x3 days. Patient has not been able to eat much in 24 hours d/t the sore throat. Denies sick contact. Patient has not taken any medications for his symptoms today.  Denies chest pain, congestion, rhinorrhea, cough, nausea, vomiting, diarrhea.   Sore Throat       Home Medications Prior to Admission medications   Medication Sig Start Date End Date Taking? Authorizing Provider  albuterol (VENTOLIN HFA) 108 (90 Base) MCG/ACT inhaler Inhale 2 puffs into the lungs every 6 (six) hours as needed for wheezing or shortness of breath. 02/04/23   Tomi Bamberger, PA-C      Allergies    Patient has no known allergies.    Review of Systems   Review of Systems  HENT:  Positive for sore throat.     Physical Exam Updated Vital Signs BP (!) 140/92 (BP Location: Right Arm)   Pulse 93   Temp 99.8 F (37.7 C)   Resp 17   Ht 5\' 5"  (1.651 m)   Wt 63.5 kg   SpO2 99%   BMI 23.30 kg/m  Physical Exam Vitals and nursing note reviewed.  Constitutional:      General: He is not in acute distress.    Appearance: He is not ill-appearing or toxic-appearing.  HENT:     Head: Normocephalic and atraumatic.     Mouth/Throat:     Mouth: Mucous membranes are moist.     Pharynx: Uvula midline. No oropharyngeal exudate or posterior oropharyngeal erythema.     Tonsils: Tonsillar exudate present. No tonsillar abscesses. 3+ on the right. 3+ on the left.     Comments: Equal 3+ swelling of BL tonsils that does not extend past tonsils. Tonsils erythematous. Mild exudate. No trismus. No dysphonia. Tolerated PO Tylenol. Eyes:     General: No scleral icterus.       Right eye:  No discharge.        Left eye: No discharge.     Conjunctiva/sclera: Conjunctivae normal.  Cardiovascular:     Rate and Rhythm: Normal rate and regular rhythm.     Pulses: Normal pulses.     Heart sounds: Normal heart sounds. No murmur heard. Pulmonary:     Effort: Pulmonary effort is normal. No respiratory distress.     Breath sounds: Normal breath sounds. No wheezing, rhonchi or rales.  Abdominal:     General: Bowel sounds are normal. There is no distension.     Palpations: Abdomen is soft. There is no mass.     Tenderness: There is no abdominal tenderness.  Musculoskeletal:     Right lower leg: No edema.     Left lower leg: No edema.  Skin:    General: Skin is warm and dry.     Findings: No rash.  Neurological:     General: No focal deficit present.     Mental Status: He is alert. Mental status is at baseline.  Psychiatric:        Mood and Affect: Mood normal.     ED Results / Procedures / Treatments   Labs (all labs ordered are listed, but only abnormal results  are displayed) Labs Reviewed  GROUP A STREP BY PCR - Abnormal; Notable for the following components:      Result Value   Group A Strep by PCR DETECTED (*)    All other components within normal limits  RESP PANEL BY RT-PCR (RSV, FLU A&B, COVID)  RVPGX2    EKG None  Radiology No results found.  Procedures Procedures    Medications Ordered in ED Medications  acetaminophen (TYLENOL) tablet 1,000 mg (1,000 mg Oral Given 09/05/23 0836)  dexamethasone (DECADRON) injection 8 mg (8 mg Intramuscular Given 09/05/23 0837)  penicillin g benzathine (BICILLIN LA) 1200000 UNIT/2ML injection 1.2 Million Units (1.2 Million Units Intramuscular Given 09/05/23 0915)    ED Course/ Medical Decision Making/ A&P                                 Medical Decision Making Risk OTC drugs. Prescription drug management.   This patient presents to the ED for concern of sore throat, this involves an extensive number of treatment  options, and is a complaint that carries with it a high risk of complications and morbidity.  The differential diagnosis includes viral/strep/gonococcal pharyngitis, tonsillar abscess/deep space infection, viral URI, sepsis.   Additional history obtained:  No PCP listed in chart. Will refer to community clinic.   Problem List / ED Course / Critical interventions / Medication management  Patient presents to ED concerned for sore throat and fever x3 days. No other infectious symptoms.  Physical exam with 3+ tonsillar swelling and exudates. No signs of tonsillar abscess. Patient initially febrile and tachycardic which resolved after oral tylenol and IM decadron were administered. Rest of physical exam reassuring. I Ordered, and personally interpreted labs.  Strep PCR positive. Resp panel negative. Shared all results with patient. Answered all questions. Provided patient with penicillin injection which they tolerated well. Recommended following up with PCP. Patient verbalized understanding of plan. I have reviewed the patients home medicines and have made adjustments as needed Patient afebrile with stable vitals. Provided with return precautions. Discharged in good condition.   Social Determinants of Health:  none         Final Clinical Impression(s) / ED Diagnoses Final diagnoses:  Strep pharyngitis    Rx / DC Orders ED Discharge Orders     None         Dorthy Cooler, [place] 09/05/23 0981    Benjiman Core, MD 09/05/23 [phone]

## 2023-09-05 NOTE — ED Triage Notes (Signed)
 Pt states here for sore throat that started 3 days ago. C/O fevers. Denies n/v/d.

## 2023-11-10 IMAGING — CR DG CHEST 2V
2 series · 2 of 2 positions shown · non-contrast
Comparison: May 19, 2017

CLINICAL DATA: Shortness of breath concern for asthma exacerbation.

EXAM:
CHEST - 2 VIEW

[w chest pa]
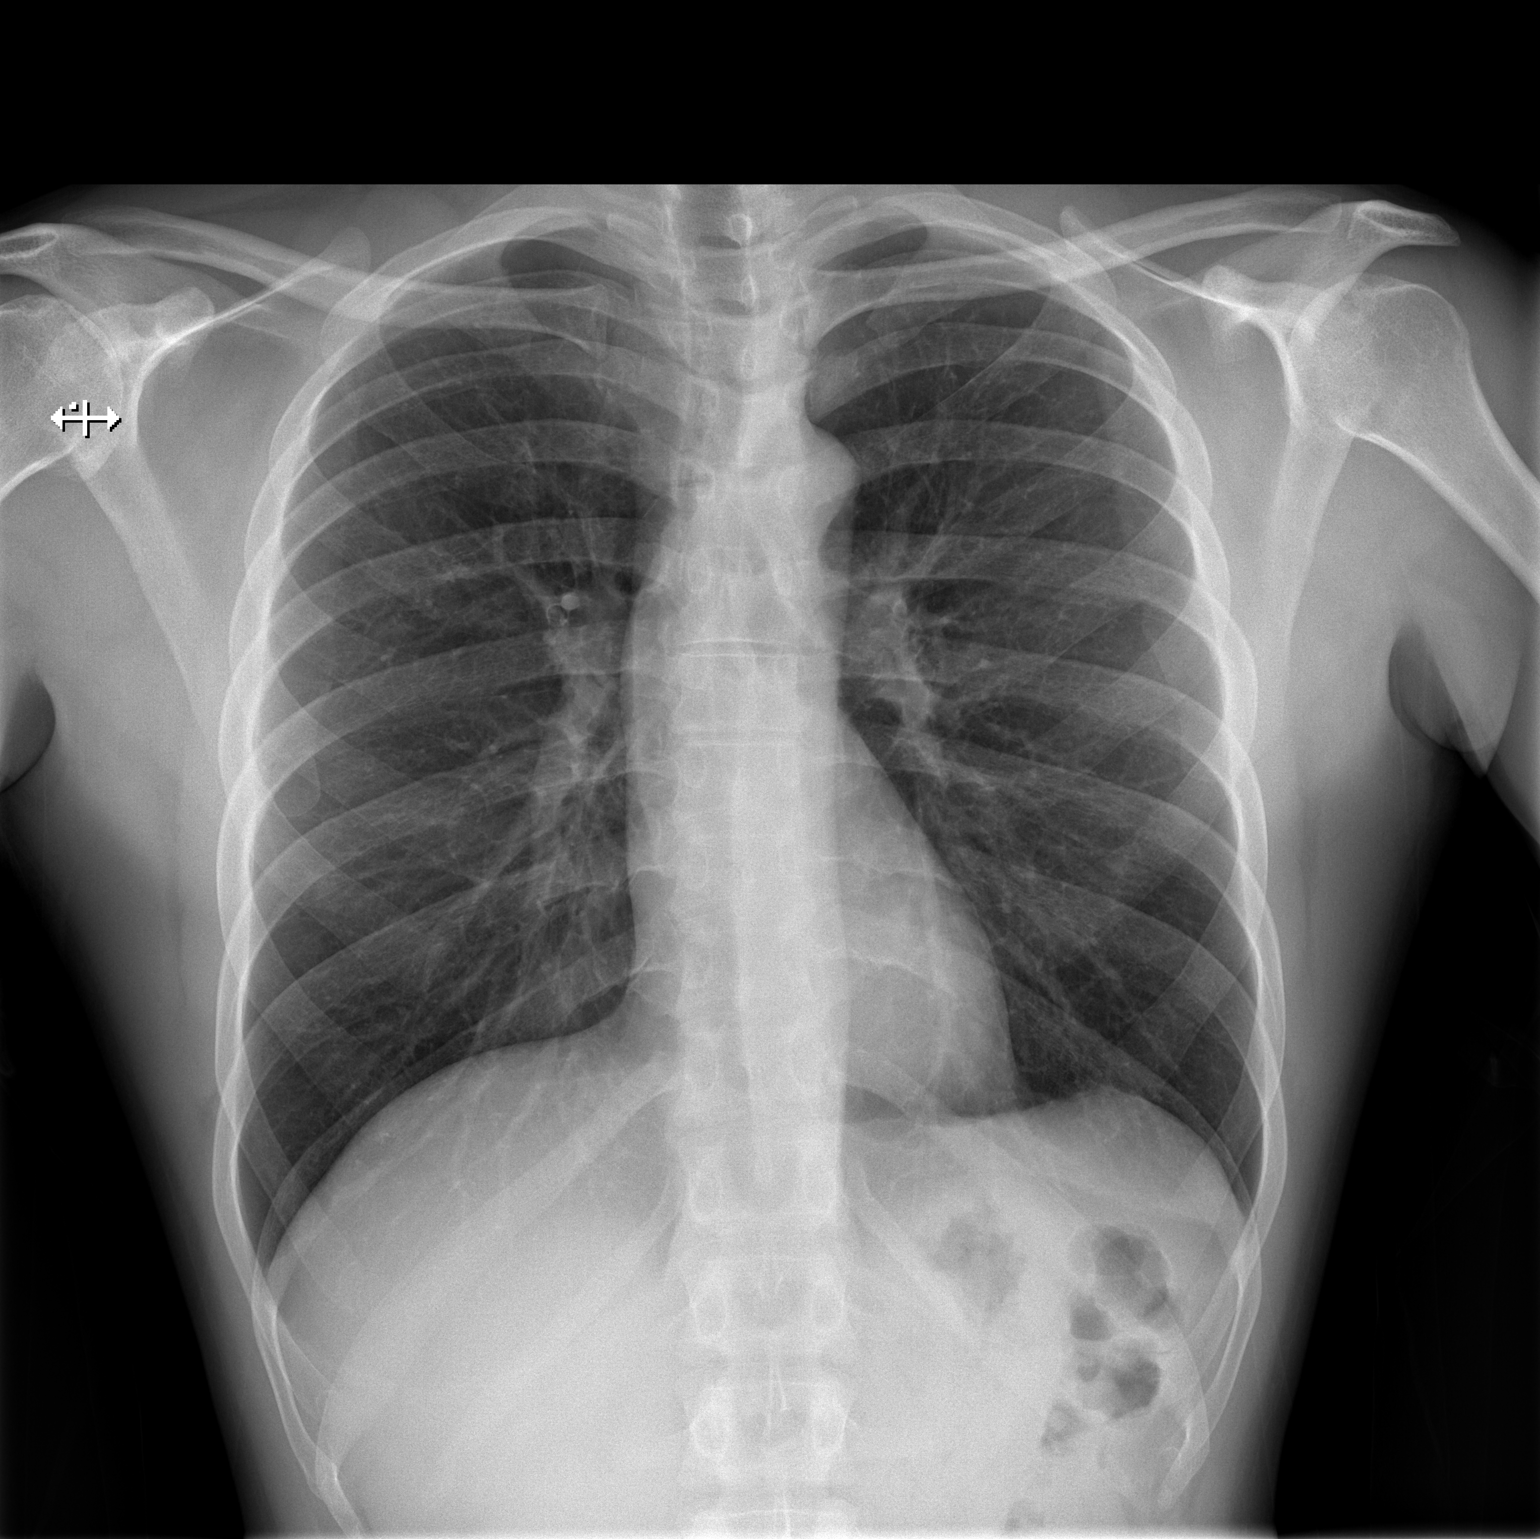

[w chest lat]
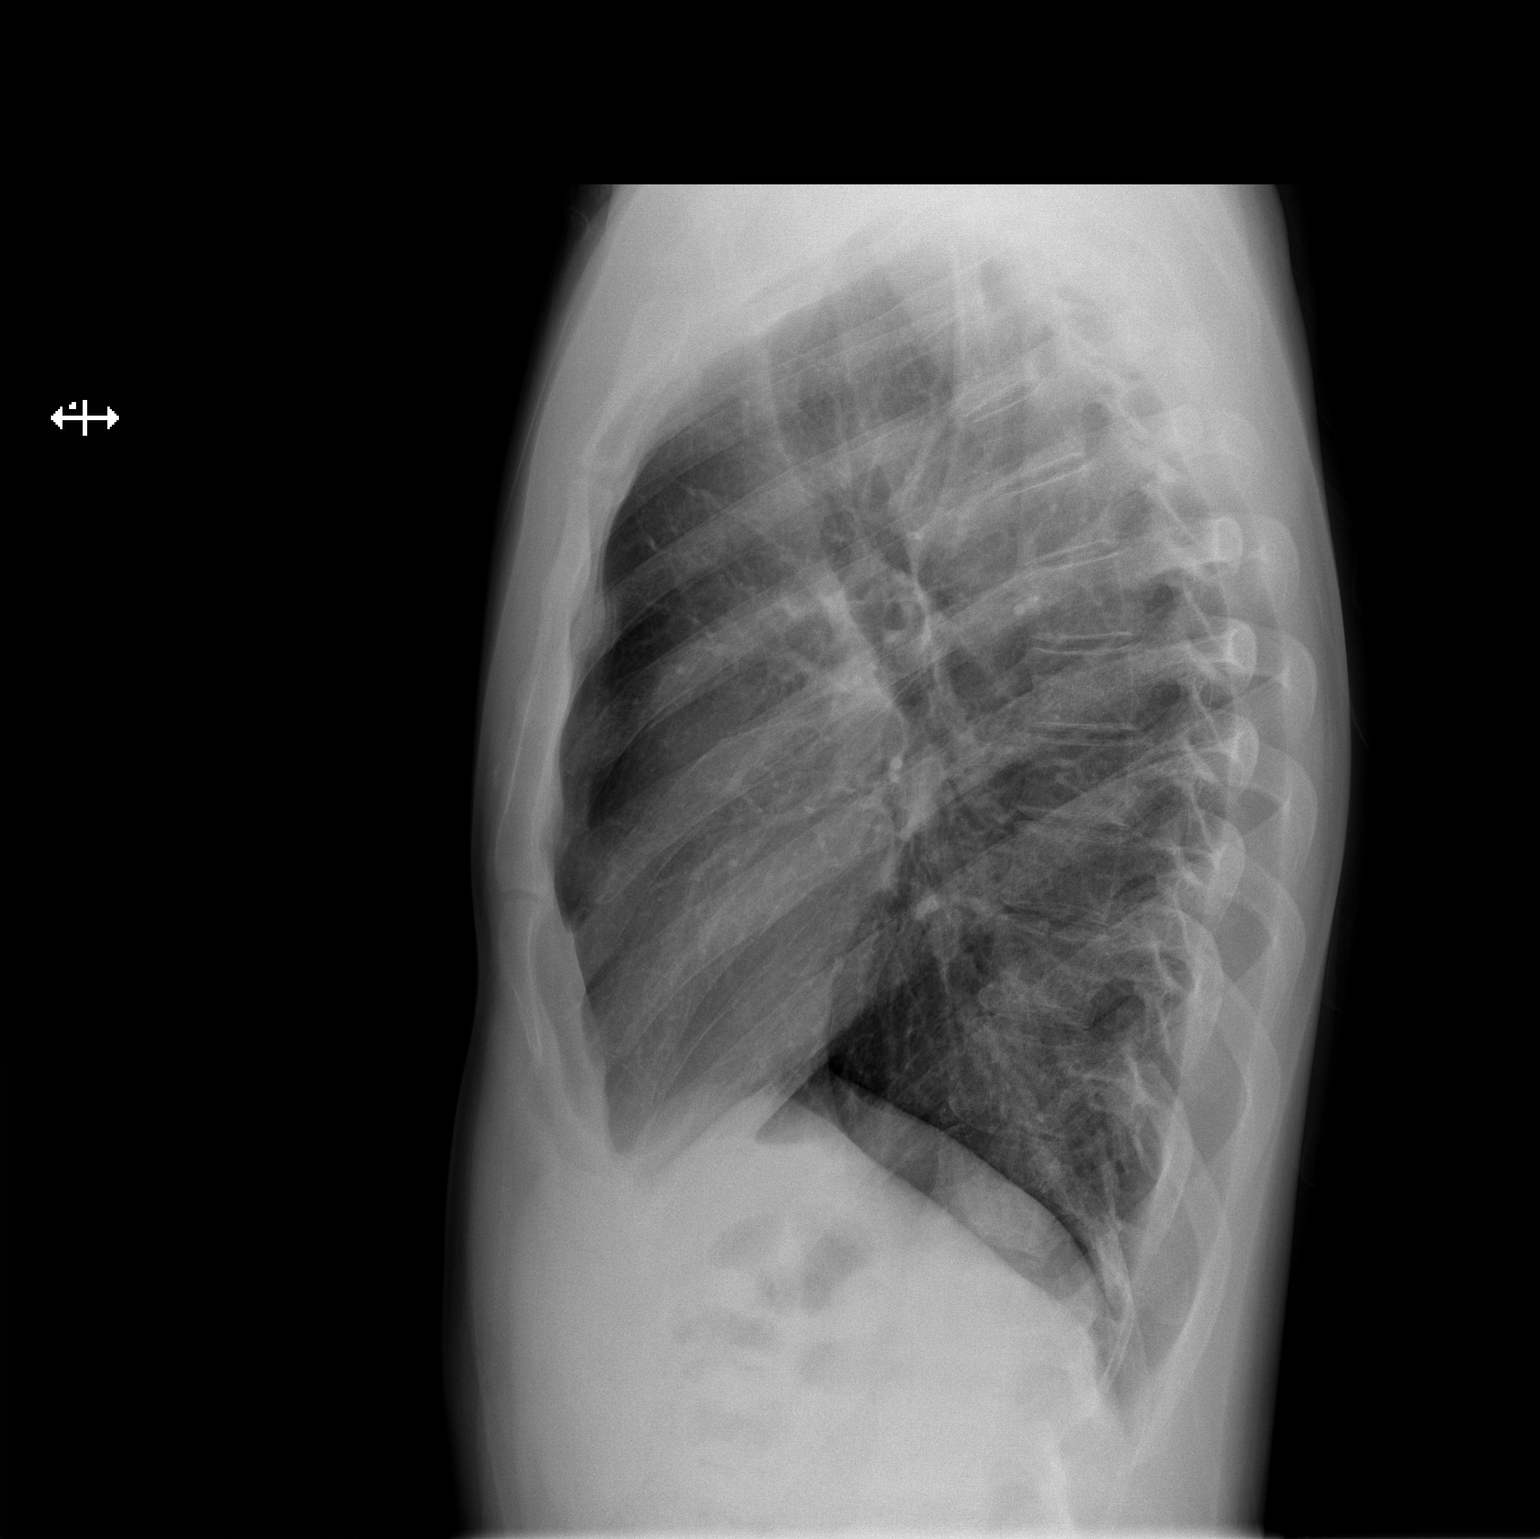

[2 of 2 positions shown; findings below may reference images not displayed]

FINDINGS: The heart size and mediastinal contours are within normal limits. No
focal consolidation. No pleural effusion. No pneumothorax. The
visualized skeletal structures are unremarkable.
IMPRESSION: No acute cardiopulmonary disease.

## 2024-02-17 ENCOUNTER — Emergency Department (HOSPITAL_COMMUNITY)
Admission: EM | Admit: 2024-02-17 | Discharge: 2024-02-17 | Disposition: A | Discharge status: Home / Self Care | Payer: Self-pay | Attending: Emergency Medicine | Admitting: Emergency Medicine

## 2024-02-17 ENCOUNTER — Encounter (HOSPITAL_COMMUNITY): Payer: Self-pay

## 2024-02-17 ENCOUNTER — Other Ambulatory Visit: Payer: Self-pay

## 2024-02-17 ENCOUNTER — Emergency Department (HOSPITAL_COMMUNITY): Payer: Self-pay

## 2024-02-17 DIAGNOSIS — J4521 Mild intermittent asthma with (acute) exacerbation: Secondary | ICD-10-CM | POA: Insufficient documentation

## 2024-02-17 DIAGNOSIS — Z760 Encounter for issue of repeat prescription: Secondary | ICD-10-CM | POA: Insufficient documentation

## 2024-02-17 LAB — RESP PANEL BY RT-PCR (RSV, FLU A&B, COVID)  RVPGX2
Influenza A by PCR: NEGATIVE
Influenza B by PCR: NEGATIVE
Resp Syncytial Virus by PCR: NEGATIVE
SARS Coronavirus 2 by RT PCR: NEGATIVE

## 2024-02-17 MED ORDER — ALBUTEROL SULFATE HFA 108 (90 BASE) MCG/ACT IN AERS: 2.0000 | INHALATION_SPRAY | Freq: Four times a day (QID) | RESPIRATORY_TRACT | 0 refills | Status: DC | PRN

## 2024-02-17 MED ORDER — ALBUTEROL SULFATE HFA 108 (90 BASE) MCG/ACT IN AERS
1.0000 | INHALATION_SPRAY | Freq: Once | RESPIRATORY_TRACT | Status: AC
  Administered 2024-02-17: 1 via RESPIRATORY_TRACT
  Filled 2024-02-17: qty 6.7

## 2024-02-17 NOTE — ED Provider Triage Note (Signed)
 Emergency Medicine Provider Triage Evaluation Note  Mario Jordan , a 38 y.o. male  was evaluated in triage.  Pt complains of body aches, chills, weak, sob.  Review of Systems  Positive: Sick contact Negative: CP  Physical Exam  BP 135/86   Pulse 75   Temp 98.1 F (36.7 C)   Resp 16   Ht 5' 5 (1.651 m)   Wt 63.5 kg   SpO2 99%   BMI 23.30 kg/m  Gen:   Awake, no distress   Resp:  Normal effort  MSK:   Moves extremities without difficulty Other:    Medical Decision Making  Medically screening exam initiated at 1:31 PM.  Appropriate orders placed.  Mario Jordan was informed that the remainder of the evaluation will be completed by another provider, this initial triage assessment does not replace that evaluation, and the importance of remaining in the ED until their evaluation is complete.     Shermon Warren SAILOR, PA-C 02/17/24 1332

## 2024-02-17 NOTE — ED Triage Notes (Signed)
 Pt states that he is having an asthma exacerbation.  He is out of his home albuterol .  Speaking in full sentences at registration.  Spo2 100% on RA

## 2024-02-17 NOTE — ED Provider Notes (Signed)
 [place] EMERGENCY DEPARTMENT AT [facility] Provider Note   CSN: [id number] Arrival date & time: 02/17/24  1157     Patient presents with: Asthma   Mario Jordan is a 38 y.o. male.   Patient with history of asthma presents today with complaints of asthma exacerbation. Reports that symptoms began yesterday and have been persistent since then. Reports that he ran out of his prescribed albuterol  inhaler about 1 month ago and has been borrowing another family members inhaler. Symptoms today feel like asthma exacerbations he has had previously. He received an albuterol  inhaler in triage previously before my evaluation and reports that he feels better and ready to go home. He does not want steroids.  Denies fevers or chills, chest pain, or shortness of breath, leg pain, or leg swelling.  The history is provided by the patient. No language interpreter was used.  Asthma       Prior to Admission medications   Medication Sig Start Date End Date Taking? Authorizing Provider  albuterol  (VENTOLIN  HFA) 108 (90 Base) MCG/ACT inhaler Inhale 2 puffs into the lungs every 6 (six) hours as needed for wheezing or shortness of breath. 02/04/23   Billy Asberry FALCON, PA-C    Allergies: Patient has no known allergies.    Review of Systems  Respiratory:  Positive for wheezing.   All other systems reviewed and are negative.   Updated Vital Signs BP (!) 148/97   Pulse 72   Temp 98.1 F (36.7 C) (Oral)   Resp 20   Ht 5' 5 (1.651 m)   Wt 63.5 kg   SpO2 100%   BMI 23.30 kg/m   Physical Exam Vitals and nursing note reviewed.  Constitutional:      General: He is not in acute distress.    Appearance: Normal appearance. He is normal weight. He is not ill-appearing, toxic-appearing or diaphoretic.  HENT:     Head: Normocephalic and atraumatic.  Cardiovascular:     Rate and Rhythm: Normal rate.  Pulmonary:     Effort: Pulmonary effort is normal. No respiratory distress.      Comments: Trace expiratory wheezing noted in bilateral lung bases. Abdominal:     General: Abdomen is flat.     Palpations: Abdomen is soft.     Tenderness: There is no abdominal tenderness.  Musculoskeletal:        General: Normal range of motion.     Cervical back: Normal range of motion.  Skin:    General: Skin is warm and dry.  Neurological:     General: No focal deficit present.     Mental Status: He is alert.  Psychiatric:        Mood and Affect: Mood normal.        Behavior: Behavior normal.     (all labs ordered are listed, but only abnormal results are displayed) Labs Reviewed  RESP PANEL BY RT-PCR (RSV, FLU A&B, COVID)  RVPGX2    EKG: None  Radiology: DG Chest 2 View Result Date: 02/17/2024 CLINICAL DATA:  SOB EXAM: CHEST - 2 VIEW COMPARISON:  08/02/2021 FINDINGS: No focal airspace consolidation, pleural effusion, or pneumothorax. No cardiomegaly.No acute fracture or destructive lesion. IMPRESSION: No acute cardiopulmonary abnormality. Electronically Signed   By: Rogelia Myers M.D.   On: 02/17/2024 14:12     Procedures   Medications Ordered in the ED  albuterol  (VENTOLIN  HFA) 108 (90 Base) MCG/ACT inhaler 1 puff (1 puff Inhalation Given 02/17/24 2014)  Medical Decision Making  This patient is a 38 y.o. male who presents to the ED for concern of asthma exacerbation x 1 day.  He is afebrile, nontoxic-appearing, and in no acute distress with reassuring vital signs.  Past Medical History / Co-morbidities / Social History:  has a past medical history of Asthma (05/21/2017), Marijuana use (05/21/2017), Nicotine dependence (05/21/2017), and Polysubstance abuse (HCC) (05/21/2017).  Additional history: Chart reviewed.  Physical Exam: Physical exam performed. The pertinent findings include: Trace expiratory wheezing at bilateral lung bases, very well-appearing, speaking in complete sentences in no acute distress.  Lab Tests: I  ordered, and personally interpreted labs.  The pertinent results include: Viral panel negative   Imaging Studies: I ordered imaging studies including CXR. I independently visualized and interpreted imaging which showed NAD. I agree with the radiologist interpretation.   Medications: Albuterol  inhaler ordered and provided to the patient in triage. Reevaluation of the patient after these medicines showed that the patient resolved. Offered steroids which patient declined. I have reviewed the patients home medicines and have made adjustments as needed.  Disposition: After consideration of the diagnostic results and the patients response to treatment, I feel that emergency department workup does not suggest an emergent condition requiring admission or immediate intervention beyond what has been performed at this time. The plan is: discharge with close outpatient follow-up and return precautions.  After above interventions, patient feels back to normal and ready to go home.  I did offer him steroids which he declined.  Symptoms likely due to his asthma given his improvement with his inhaler and no other concerning findings to suggest ACS/PE or infectious etiology.  Discussing with patient is understanding and in agreement with this.  Therefore no indication for further evaluation with additional labs or imaging. Evaluation and diagnostic testing in the emergency department does not suggest an emergent condition requiring admission or immediate intervention beyond what has been performed at this time.  Plan for discharge with close PCP follow-up.  Patient is understanding and amenable with plan, educated on red flag symptoms that would prompt immediate return.  Patient discharged in stable condition.  Final diagnoses:  Mild intermittent asthma with exacerbation  Medication refill    ED Discharge Orders          Ordered    albuterol  (VENTOLIN  HFA) 108 (90 Base) MCG/ACT inhaler  Every 6 hours PRN         02/17/24 2056          An After Visit Summary was printed and given to the patient.      Nora Lauraine DELENA DEVONNA 02/17/24 2057    Francesca Elsie CROME, MD 02/17/24 [phone]

## 2024-02-17 NOTE — Discharge Instructions (Addendum)
 As we discussed, your workup in the ER today was reassuring for acute findings.  You tested negative for COVID, flu, and RSV and your chest x-ray was clear.  I suspect your symptoms are due to your asthma and being out of your medication.  We have provided you with an inhaler today and I have refilled your medication as well.  For additional refills, please follow-up with the PCP.  Please use your inhaler as prescribed as needed for management of your asthma symptoms.  Return if development of any new or worsening symptoms.

## 2024-02-17 NOTE — ED Notes (Signed)
 ..  The patient is A&OX4, ambulatory at d/c with independent steady gait, NAD. Pt verbalized understanding of d/c instructions, prescription and follow up care.

## 2024-04-24 ENCOUNTER — Encounter (HOSPITAL_COMMUNITY): Payer: Self-pay | Admitting: *Deleted

## 2024-04-24 ENCOUNTER — Ambulatory Visit (HOSPITAL_COMMUNITY)
Admission: EM | Admit: 2024-04-24 | Discharge: 2024-04-24 | Disposition: A | Discharge status: Home / Self Care | Payer: Self-pay

## 2024-04-24 ENCOUNTER — Other Ambulatory Visit: Payer: Self-pay

## 2024-04-24 DIAGNOSIS — Z760 Encounter for issue of repeat prescription: Secondary | ICD-10-CM

## 2024-04-24 MED ORDER — ALBUTEROL SULFATE HFA 108 (90 BASE) MCG/ACT IN AERS
1.0000 | INHALATION_SPRAY | Freq: Four times a day (QID) | RESPIRATORY_TRACT | 2 refills | Status: AC | PRN
Start: 2024-04-24 — End: ?

## 2024-04-24 NOTE — ED Provider Notes (Signed)
 [facility]  Note:  This document was prepared using Conservation officer, historic buildings and may include unintentional dictation errors.  MRN: [id number] DOB: April 02, 1986  Subjective:   Mario Jordan is a 38 y.o. male presenting for evaluation of recent shortness of breath and wheezing.  Patient states that this morning at 4 AM symptoms woke him from sleep.  Patient states that he ran out of his inhaler several months ago.  Patient states that he feels tightness and wheezing in his lungs.  Did not go to work today and needs a work note.  Patient appears to be under the influence of unknown substance as falling asleep and unable to answer questions, not able to sit up in chair during triage or evaluation.  Patient requesting refill of albuterol  inhaler and work note.  No current facility-administered medications for this encounter.  Current Outpatient Medications:    albuterol  (VENTOLIN  HFA) 108 (90 Base) MCG/ACT inhaler, Inhale 1-2 puffs into the lungs every 6 (six) hours as needed for wheezing or shortness of breath., Disp: 8 g, Rfl: 2   No Known Allergies  Past Medical History:  Diagnosis Date   Asthma 05/21/2017   Marijuana use 05/21/2017   Nicotine dependence 05/21/2017   Polysubstance abuse (HCC) 05/21/2017     Past Surgical History:  Procedure Laterality Date   ORIF CALCANEOUS FRACTURE Right 05/20/2017   Procedure: OPEN REDUCTION INTERNAL FIXATION (ORIF) CALCANEOUS FRACTURE;  Surgeon: Celena Sharper, MD;  Location: [facility] OR;  Service: Orthopedics;  Laterality: Right;    Family History  Problem Relation Age of Onset   Healthy Mother    Healthy Father     Social History   Tobacco Use   Smoking status: Some Days    Types: Cigarettes   Smokeless tobacco: Never  Vaping Use   Vaping status: Never Used  Substance Use Topics   Alcohol use: Yes    Comment: occasion   Drug use: Not Currently    Types: Cocaine, Methamphetamines    ROS Refer to HPI for  ROS details.  Objective:    Vitals: Pulse (!) 105   Temp 98.2 F (36.8 C)   Resp (!) 22   SpO2 100%   Physical Exam Vitals and nursing note reviewed.  Constitutional:      General: He is sleeping. He is not in acute distress.    Appearance: Normal appearance. He is well-developed. He is not ill-appearing or toxic-appearing.  HENT:     Head: Normocephalic.  Cardiovascular:     Rate and Rhythm: Normal rate and regular rhythm.     Heart sounds: Normal heart sounds. No murmur heard. Pulmonary:     Effort: Pulmonary effort is normal. No respiratory distress.     Breath sounds: Normal breath sounds. No stridor. No wheezing, rhonchi or rales.  Chest:     Chest wall: No tenderness.  Skin:    General: Skin is warm and dry.  Neurological:     General: No focal deficit present.     Mental Status: He is oriented to person, place, and time and easily aroused.  Psychiatric:        Mood and Affect: Mood normal.        Behavior: Behavior normal. Behavior is cooperative.     Procedures  No results found for this or any previous visit (from the past 24 hours).  Assessment and Plan :     Discharge Instructions       1. Medication refill (Primary) - albuterol  (  VENTOLIN  HFA) 108 (90 Base) MCG/ACT inhaler; Inhale 1-2 puffs into the lungs every 6 (six) hours as needed for wheezing or shortness of breath.  Dispense: 8 g; Refill: 2  -Continue to monitor symptoms for any change in severity if there is any escalation of current symptoms or development of new symptoms follow-up in ER for further evaluation and management.      Devota Viruet B Sani Loiseau   Shylo Zamor, [place], [place] 04/24/24 1314

## 2024-04-24 NOTE — ED Triage Notes (Addendum)
 PT reports around 0400 he reports the [facility] started. Pt ran out of his HHN last month. Pt tearful on arrival to triage room . Pt reports he feels tightness in his lungs. Pt also request a work note. PT reluctent to answer questions.

## 2024-04-24 NOTE — Discharge Instructions (Signed)
  1. Medication refill (Primary) - albuterol  (VENTOLIN  HFA) 108 (90 Base) MCG/ACT inhaler; Inhale 1-2 puffs into the lungs every 6 (six) hours as needed for wheezing or shortness of breath.  Dispense: 8 g; Refill: 2  -Continue to monitor symptoms for any change in severity if there is any escalation of current symptoms or development of new symptoms follow-up in ER for further evaluation and management.
